# Patient Record
Sex: Female | Born: 1965 | Hispanic: No | Marital: Single | Smoking: Never smoker
Health system: Southern US, Community
[De-identification: ages and names within clinical notes are randomized; demographics above are authoritative.]

## PROBLEM LIST (undated history)

## (undated) DIAGNOSIS — G43109 Migraine with aura, not intractable, without status migrainosus: Secondary | ICD-10-CM

## (undated) HISTORY — DX: Migraine with aura, not intractable, without status migrainosus: G43.109

## (undated) HISTORY — PX: APPENDECTOMY: SHX54

---

## 1998-03-22 ENCOUNTER — Other Ambulatory Visit: Admission: RE | Admit: 1998-03-22 | Discharge: 1998-03-22 | Payer: Self-pay | Admitting: Obstetrics and Gynecology

## 1998-05-03 ENCOUNTER — Other Ambulatory Visit: Admission: RE | Admit: 1998-05-03 | Discharge: 1998-05-03 | Payer: Self-pay | Admitting: Obstetrics and Gynecology

## 1998-09-08 ENCOUNTER — Other Ambulatory Visit: Admission: RE | Admit: 1998-09-08 | Discharge: 1998-09-08 | Payer: Self-pay | Admitting: Obstetrics and Gynecology

## 1999-01-03 HISTORY — PX: CERVICAL BIOPSY  W/ LOOP ELECTRODE EXCISION: SUR135

## 1999-03-21 ENCOUNTER — Other Ambulatory Visit: Admission: RE | Admit: 1999-03-21 | Discharge: 1999-03-21 | Payer: Self-pay | Admitting: Obstetrics and Gynecology

## 1999-04-11 ENCOUNTER — Other Ambulatory Visit: Admission: RE | Admit: 1999-04-11 | Discharge: 1999-04-11 | Payer: Self-pay | Admitting: Obstetrics and Gynecology

## 1999-10-04 ENCOUNTER — Other Ambulatory Visit: Admission: RE | Admit: 1999-10-04 | Discharge: 1999-10-04 | Payer: Self-pay | Admitting: Obstetrics and Gynecology

## 2000-04-19 ENCOUNTER — Other Ambulatory Visit: Admission: RE | Admit: 2000-04-19 | Discharge: 2000-04-19 | Payer: Self-pay | Admitting: Obstetrics and Gynecology

## 2000-10-08 ENCOUNTER — Other Ambulatory Visit: Admission: RE | Admit: 2000-10-08 | Discharge: 2000-10-08 | Payer: Self-pay | Admitting: Obstetrics and Gynecology

## 2001-04-24 ENCOUNTER — Other Ambulatory Visit: Admission: RE | Admit: 2001-04-24 | Discharge: 2001-04-24 | Payer: Self-pay | Admitting: Obstetrics and Gynecology

## 2002-04-28 ENCOUNTER — Other Ambulatory Visit: Admission: RE | Admit: 2002-04-28 | Discharge: 2002-04-28 | Payer: Self-pay | Admitting: Obstetrics and Gynecology

## 2002-10-06 ENCOUNTER — Other Ambulatory Visit: Admission: RE | Admit: 2002-10-06 | Discharge: 2002-10-06 | Payer: Self-pay | Admitting: Obstetrics and Gynecology

## 2003-04-21 ENCOUNTER — Other Ambulatory Visit: Admission: RE | Admit: 2003-04-21 | Discharge: 2003-04-21 | Payer: Self-pay | Admitting: Obstetrics and Gynecology

## 2003-10-27 ENCOUNTER — Other Ambulatory Visit: Admission: RE | Admit: 2003-10-27 | Discharge: 2003-10-27 | Payer: Self-pay | Admitting: Obstetrics and Gynecology

## 2004-04-22 ENCOUNTER — Other Ambulatory Visit: Admission: RE | Admit: 2004-04-22 | Discharge: 2004-04-22 | Payer: Self-pay | Admitting: Obstetrics and Gynecology

## 2011-02-07 ENCOUNTER — Ambulatory Visit (INDEPENDENT_AMBULATORY_CARE_PROVIDER_SITE_OTHER): Payer: BC Managed Care – PPO | Admitting: Family Medicine

## 2011-02-07 VITALS — BP 141/92 | HR 101 | Temp 98.4°F | Resp 16 | Ht 65.0 in | Wt 138.0 lb

## 2011-02-07 DIAGNOSIS — J209 Acute bronchitis, unspecified: Secondary | ICD-10-CM

## 2011-02-07 DIAGNOSIS — J329 Chronic sinusitis, unspecified: Secondary | ICD-10-CM

## 2011-02-07 MED ORDER — BENZONATATE 100 MG PO CAPS: 100.0000 mg | ORAL_CAPSULE | Freq: Three times a day (TID) | ORAL | Status: AC | PRN

## 2011-02-07 MED ORDER — AZITHROMYCIN 250 MG PO TABS: ORAL_TABLET | ORAL | Status: AC

## 2011-02-07 NOTE — Progress Notes (Signed)
  Patient Name: Amy [name] Date of Birth: 1965-09-24 Medical Record Number: [id number] Gender: female Date of Encounter: 02/07/2011  History of Present Illness:  Amy [name] is a 46 y.o. very pleasant female patient who presents with the following:  Started to get ill 3 days ago with cold sx, "deep sneeze."  Thick drainage in her throat, thirsty.  Has started to have a cough now, cough attacks.  Delsym and cough drops to help control cough.  Mild HA the last 2 days- a "twinge."  History of seasonal AR, using neti-pot BID and saline spray.  Sinuses are tender, crusty nasal discharge.  Did not exercise today which is very unusual for her, feels fatigued but no aches.  No fever or chills.  No GI sx  There is no problem list on file for this patient.  Past Medical History  Diagnosis Date  . Migraine aura without headache    Past Surgical History  Procedure Date  . Cervical biopsy  w/ loop electrode excision 2001    no further procedures needed   History  Substance Use Topics  . Smoking status: Never Smoker   . Smokeless tobacco: Never Used  . Alcohol Use: Yes     very occasionally   Family History  Problem Relation Age of Onset  . Heart disease Father   . Stroke Maternal Grandfather    Allergies  Allergen Reactions  . Penicillins Hives    Medication list has been reviewed and updated.  Review of Systems: As per HPI, otherwise negative  Physical Examination: Filed Vitals:   02/07/11 1552  BP: 141/92  Pulse: 101  Temp: 98.4 F (36.9 C)  TempSrc: Oral  Resp: 16  Height: 5\' 5"  (1.651 m)  Weight: 138 lb (62.596 kg)    Body mass index is 22.96 kg/(m^2).  GEN: WDWN, NAD, Non-toxic, A & O x 3 HEENT: Atraumatic, Normocephalic. Neck supple. No masses, No LAD. Ears and Nose: No external deformity.  TM wnl, oropharynx wnl.  Nares are irritated, nasal cavity inflamed, frontal sinuses tender CV: RRR, No M/G/R. No JVD. No thrill. No extra heart sounds. PULM: CTA  B, no wheezes, crackles, rhonchi. No retractions. No resp. distress. No accessory muscle use. Skin:  Some blanching hives over chest but no other rash EXTR: No c/c/e NEURO Normal gait.  PSYCH: Normally interactive. Conversant. Not depressed or anxious appearing.  Calm demeanor.   Assessment and Plan: 1. Sinusitis  azithromycin (ZITHROMAX) 250 MG tablet, benzonatate (TESSALON) 100 MG capsule   Patient (or parent if minor) instructed to return to clinic or call if not better in 3 day(s). Rest, plenty of fluids.  Follow- up   BP mildly elevated today- usually is fine.  Suspect due to OTC cold medication use.  Will follow- up if persistent

## 2012-08-23 ENCOUNTER — Telehealth: Payer: Self-pay

## 2012-08-23 ENCOUNTER — Ambulatory Visit (INDEPENDENT_AMBULATORY_CARE_PROVIDER_SITE_OTHER): Payer: BC Managed Care – PPO | Admitting: Family Medicine

## 2012-08-23 VITALS — BP 132/84 | HR 84 | Temp 98.0°F | Resp 18 | Ht 65.0 in | Wt 138.0 lb

## 2012-08-23 DIAGNOSIS — G43909 Migraine, unspecified, not intractable, without status migrainosus: Secondary | ICD-10-CM

## 2012-08-23 MED ORDER — ELETRIPTAN HYDROBROMIDE 40 MG PO TABS: ORAL_TABLET | ORAL | Status: DC

## 2012-08-23 MED ORDER — PROMETHAZINE HCL 25 MG/ML IJ SOLN
25.0000 mg | Freq: Once | INTRAMUSCULAR | Status: AC
  Administered 2012-08-23: 25 mg via INTRAMUSCULAR

## 2012-08-23 NOTE — Telephone Encounter (Signed)
Patient was seen today and migraine treated. Can we work on her prior auth?

## 2012-08-23 NOTE — Telephone Encounter (Signed)
Patient calling to check status of prior authorization. She said her pharmacy has sent a request yesterday. Patient says she has a migraine and needs this medication that was prescribed by Dr. Patsy Lager. She says it is urgent.   [phone]

## 2012-08-23 NOTE — Telephone Encounter (Signed)
LMOM for pt at # pt left. Advised her that we will not be able to get a PA approved today, usually takes at least 1-2 days. Dr Patsy Lager suggested two options. Pt could buy a couple of tablets to get her over this HA, or we can try to send in a different triptan, such as sumatriptan to see if it will be covered. Asked pt to CB if she wants a different Rx, and/or to call if she has info about other medications she has tried/failed for migraines that could help to get the Relpax approved next week.

## 2012-08-23 NOTE — Patient Instructions (Addendum)
Rest- let me know if you do not feel better by later today.  If you have any other problems please let me know.

## 2012-08-23 NOTE — Progress Notes (Signed)
[facility] [address], [place] [address] [phone]  Date:  08/23/2012   Name:  Amy [name]   DOB:  07-15-1965   MRN:  [id number]  PCP:  Juluis Mire, MD    Chief Complaint: Nausea, Migraine and Photophobia   History of Present Illness:  Amy [name] is a 47 y.o. very pleasant female patient who presents with the following:  History of migraine HA, but she has not had a migraine in a couple of years. 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  Noted a HA this am and took advil.  Went back to bed and rested.  Mid- morning she noted nausea, and worsening pain.  She had dry- heaves at school.  She notes the HA on the right side of her head, and she notes photophobia and mild phonophobia. This HA is less severe than migraines she has had in the past.    She thinks she may have gotten this HA due to recent lack of sleep and stress.  Her mother brought her in today.   LMP 2 days ago.  She states there is no chance of pregnancy.   She is otherwise generally healthy   She has done much better recently through avoiding trigger foods, alcohol, stress, and by getting enough sleep.    There are no active problems to display for this patient.   Past Medical History  Diagnosis Date  . Migraine aura without headache     Past Surgical History  Procedure Laterality Date  . Cervical biopsy  w/ loop electrode excision  2001    no further procedures needed    History  Substance Use Topics  . Smoking status: Never Smoker   . Smokeless tobacco: Never Used  . Alcohol Use: Yes     Comment: very occasionally    Family History  Problem Relation Age of Onset  . Heart disease Father   . Stroke Maternal Grandfather     Allergies  Allergen Reactions  . Penicillins Hives    Medication list has been reviewed and updated.  Current Outpatient Prescriptions on File Prior to Visit  Medication Sig Dispense Refill  . aspirin 81 MG tablet Take  81 mg by mouth daily.      . calcium carbonate 200 MG capsule Take by mouth daily.       . cetirizine (ZYRTEC) 10 MG tablet Take 10 mg by mouth daily.      . Multiple Vitamin (MULTIVITAMIN) tablet Take 1 tablet by mouth daily.       No current facility-administered medications on file prior to visit.    Review of Systems:  As per HPI- otherwise negative.   Physical Examination: Filed Vitals:   08/23/12 1410  BP: 132/84  Pulse: 84  Temp: 98 F (36.7 C)  Resp: 18   Filed Vitals:   08/23/12 1410  Height: 5\' 5"  (1.651 m)  Weight: 138 lb (62.596 kg)   Body mass index is 22.96 kg/(m^2). Ideal Body Weight: Weight in (lb) to have BMI = 25: 149.9  GEN: WDWN, NAD, Non-toxic, A & O x 3, sitting in darkened room but able to tolerate light being on.  Pleasant and calm HEENT: Atraumatic, Normocephalic. Neck supple. No masses, No LAD. Bilateral TM wnl, oropharynx normal.  PEERL,EOMI.   Ears and Nose: No external deformity. CV: RRR, No M/G/R. No JVD. No thrill. No extra heart sounds. PULM: CTA B, no wheezes, crackles, rhonchi.  No retractions. No resp. distress. No accessory muscle use. ABD: S, NT, ND, +BS. No rebound. No HSM. EXTR: No c/c/e NEURO Normal gait.   Normal LE and UE strength, DTR and sensation all extremities.  Negative cerebellar function, normal RAM of hands  PSYCH: Normally interactive. Conversant. Not depressed or anxious appearing.  Calm demeanor.    Assessment and Plan: Migraine - Plan: eletriptan (RELPAX) 40 MG tablet, promethazine (PHENERGAN) injection 25 mg  Recurrent migraine HA. Refilled her relpax which she has used in the past with success.  Given a shot of phenergan here prior to going home.    Signed Abbe Amsterdam, MD

## 2012-08-26 NOTE — Telephone Encounter (Signed)
LMOM to CB to give [place] info about prior meds tried/failed for migraines. Form is in [place] new PA box.  Pt CB and reported that she has tried Imitrex in the past and it was ineffective. She also used Vioxx which was effective but ins stopped paying for it. She has been stable on Relpax when needed for several years, but is often able to prevent migraines by avoiding known triggers. Completed PA on covermymeds and awaiting decision.

## 2012-08-28 NOTE — Telephone Encounter (Signed)
Received fax from [employer] for add'l info. Stated that Rx is covered for max of #8/mos. Called pharm and they were able to get the quantity of #8 to go through. LMOM for pt with info.

## 2012-09-03 ENCOUNTER — Ambulatory Visit (INDEPENDENT_AMBULATORY_CARE_PROVIDER_SITE_OTHER): Payer: BC Managed Care – PPO | Admitting: Family Medicine

## 2012-09-03 VITALS — BP 118/74 | HR 90 | Temp 98.2°F | Resp 18 | Ht 65.0 in | Wt 139.0 lb

## 2012-09-03 DIAGNOSIS — M25569 Pain in unspecified knee: Secondary | ICD-10-CM

## 2012-09-03 DIAGNOSIS — M7989 Other specified soft tissue disorders: Secondary | ICD-10-CM

## 2012-09-03 DIAGNOSIS — I809 Phlebitis and thrombophlebitis of unspecified site: Secondary | ICD-10-CM

## 2012-09-03 DIAGNOSIS — M25562 Pain in left knee: Secondary | ICD-10-CM

## 2012-09-03 NOTE — Patient Instructions (Addendum)
We will schedule the ultrasound tomorrow - will try to have this done tomorrow after 3:30pm.  Apply to heat to area and other instructions below. Over the counter ibuprofen up to 600mg  every 6 hours as needed with food.  Return to the clinic or go to the nearest emergency room if any of your symptoms worsen or new symptoms occur.  Phlebitis Phlebitis is a redness, tenderness and soreness (inflammation) in a vein. This can occur in your arms, legs, or torso (trunk), as well as deeper inside your body.  CAUSES  Phlebitis can be triggered by multiple factors. These include:  Reduced (restricted) blood flow through your veins. This happens with prolonged bed rest, long distance travel, injury or surgery. Being overweight (obese) and pregnant can also restrict blood flow and lead to phlebitis.  Putting a catheter in the vein (intravenous or IV) and giving certain medications through in the vein (intravenously).  Cancer and cancer treatment.  Use of illegal intravenous drugs.  Inflammatory diseases.  Inherited (genetic) diseases that increase the risk for blood clots.  Hormone therapy (such as birth control pills). SYMPTOMS   Red, tender, swollen, painful area on your skin.  Usually, the area will be long and narrow.  Low grade fever.  Significant firmness along the center of this area. This can indicate that a blood clot has formed.  Surrounding redness or a high fever, which can indicate an infection (cellulitis). DIAGNOSIS   The appearance of your condition and your symptoms will cause your caregiver to suspect phlebitis. Usually, this is enough for a diagnosis.  Your caregiver may request blood tests or an ultrasound test of the area to be sure you do not have an infection or a blood clot. Blood tests and discussing your family history may also indicate if you have an underlying genetic disease that causes blood clots.  Occasionally, a piece of tissue is taken from the body  (biopsy) if an unusual cause of phlebitis is suspected. TREATMENT   Raise (elevate) the affected area above the level of the heart.  Apply a warm compress or heating pad for 20 minutes, 3 or 4 times a day. If you use an electric heating pad, follow the directions so you do not burn yourself.  Anti-inflammatory medications are usually recommended. Follow your caregiver's directions.  Any IV catheter, if present, will be removed by your caregiver.  Your caregiver may prescribe medicines that kill germs (antibiotics) if an infection is present.  Your caregiver may recommend blood thinners if a blood clot is suspected or present.  Support stockings or bandages may be helpful, depending on the cause and location of the phlebitis.  Surgery may be needed to remove very damaged sections of vein, but this is rare. HOME CARE INSTRUCTIONS   Take medications exactly as prescribed.  Follow up with your caregiver as directed.  Use support stockings or bandages if advised. These will speed healing and prevent recurrence.  If you are on blood thinners:  Do follow-up blood tests exactly as directed.  Check with your caregiver before using any new medications.  Wear a pendant to show that you are on blood thinners.  For phlebitis in the legs:  Avoid prolonged standing or bed rest.  Keep your legs moving. Raise your legs with sitting or lying.  Do not smoke.  Women, particularly those over the age of 13, should consider the risks and benefits of taking the contraceptive pill. This kind of hormone treatment can increase your risk for  blood clots. SEEK MEDICAL CARE IF:   You have unusual bruising or any bleeding problems.  Swelling or pain in your affected arm or leg is not gradually improving.  You are on anti-inflammatory medication and you develop belly (abdominal) pain. SEEK IMMEDIATE MEDICAL CARE IF:   An unexplained oral temperature above 100.5 F (38.1 C) develops.  You have  sudden onset of chest pain or difficulty breathing. Document Released: 12/13/2000 Document Revised: 03/13/2011 Document Reviewed: 09/14/2008 [facility] Patient Information 2014 [place], [place].

## 2012-09-03 NOTE — Progress Notes (Signed)
Subjective:    Patient ID: Amy [name], female    DOB: Mar 18, 1965, 47 y.o.   MRN: [id number]  HPI Amy [name] is a 47 y.o. female  6 days ago - woke up with pain in L lower leg. NKI. Tried ice, massage.  Warm knot on inside lower part of leg last week.  More sore if seated then getting up to move. Swollen around lower leg to ankle now. More sore today. Up and down with school.   No recent prolonged car travel or air travel, does take OCP's, but nonsmoker, no hx of blood clots, no FH of clotting disorder.    Tx: ice, blue ice, advil, tylenol - min relief.  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      Review of Systems  Constitutional: Negative for fever, chills and unexpected weight change.  Respiratory: Negative for shortness of breath.   Cardiovascular: Negative for chest pain.  Musculoskeletal: Positive for arthralgias.  Skin: Positive for color change (few small red bumps in swollen area. ).       Objective:   Physical Exam  Vitals reviewed. Constitutional: She is oriented to person, place, and time. She appears well-developed and well-nourished.  HENT:  Head: Normocephalic and atraumatic.  Neck: Carotid bruit is not present.  Cardiovascular: Normal rate, regular rhythm, normal heart sounds, intact distal pulses and normal pulses.   Pulses:      Dorsalis pedis pulses are 2+ on the left side.  Pulmonary/Chest: Effort normal and breath sounds normal.  Abdominal: She exhibits no pulsatile midline mass. There is no tenderness.  Musculoskeletal:       Left lower leg: She exhibits tenderness and swelling. She exhibits no deformity.       Legs: Neurological: She is alert and oriented to person, place, and time.  nvi distally.   Skin: Skin is warm and dry.  Psychiatric: She has a normal mood and affect. Her behavior is normal.       Assessment & Plan:  Amy [name] is a 47 y.o. female Superficial thrombophlebitis - Plan: Lower Extremity  Venous Duplex Left  Pain in joint, lower leg, left - Plan: Lower Extremity Venous Duplex Left  Leg swelling - Plan: Lower Extremity Venous Duplex Left  Suspected LLE superficial thrombophlebitis.  No deep calf ttp, but as on OCP's will check doppler tomorrow to rule out deep component.   Sx care reviewed with heat, ibuprofen, rtc precautions.   Patient Instructions  We will schedule the ultrasound tomorrow - will try to have this done tomorrow after 3:30pm.  Apply to heat to area and other instructions below. Over the counter ibuprofen up to 600mg  every 6 hours as needed with food.  Return to the clinic or go to the nearest emergency room if any of your symptoms worsen or new symptoms occur.  Phlebitis Phlebitis is a redness, tenderness and soreness (inflammation) in a vein. This can occur in your arms, legs, or torso (trunk), as well as deeper inside your body.  CAUSES  Phlebitis can be triggered by multiple factors. These include:  Reduced (restricted) blood flow through your veins. This happens with prolonged bed rest, long distance travel, injury or surgery. Being overweight (obese) and pregnant can also restrict blood flow and lead to phlebitis.  Putting a catheter in the vein (intravenous or IV) and giving certain medications through in the vein (intravenously).  Cancer and cancer treatment.  Use of illegal intravenous drugs.  Inflammatory diseases.  Inherited (genetic)  diseases that increase the risk for blood clots.  Hormone therapy (such as birth control pills). SYMPTOMS   Red, tender, swollen, painful area on your skin.  Usually, the area will be long and narrow.  Low grade fever.  Significant firmness along the center of this area. This can indicate that a blood clot has formed.  Surrounding redness or a high fever, which can indicate an infection (cellulitis). DIAGNOSIS   The appearance of your condition and your symptoms will cause your caregiver to suspect  phlebitis. Usually, this is enough for a diagnosis.  Your caregiver may request blood tests or an ultrasound test of the area to be sure you do not have an infection or a blood clot. Blood tests and discussing your family history may also indicate if you have an underlying genetic disease that causes blood clots.  Occasionally, a piece of tissue is taken from the body (biopsy) if an unusual cause of phlebitis is suspected. TREATMENT   Raise (elevate) the affected area above the level of the heart.  Apply a warm compress or heating pad for 20 minutes, 3 or 4 times a day. If you use an electric heating pad, follow the directions so you do not burn yourself.  Anti-inflammatory medications are usually recommended. Follow your caregiver's directions.  Any IV catheter, if present, will be removed by your caregiver.  Your caregiver may prescribe medicines that kill germs (antibiotics) if an infection is present.  Your caregiver may recommend blood thinners if a blood clot is suspected or present.  Support stockings or bandages may be helpful, depending on the cause and location of the phlebitis.  Surgery may be needed to remove very damaged sections of vein, but this is rare. HOME CARE INSTRUCTIONS   Take medications exactly as prescribed.  Follow up with your caregiver as directed.  Use support stockings or bandages if advised. These will speed healing and prevent recurrence.  If you are on blood thinners:  Do follow-up blood tests exactly as directed.  Check with your caregiver before using any new medications.  Wear a pendant to show that you are on blood thinners.  For phlebitis in the legs:  Avoid prolonged standing or bed rest.  Keep your legs moving. Raise your legs with sitting or lying.  Do not smoke.  Women, particularly those over the age of 34, should consider the risks and benefits of taking the contraceptive pill. This kind of hormone treatment can increase your  risk for blood clots. SEEK MEDICAL CARE IF:   You have unusual bruising or any bleeding problems.  Swelling or pain in your affected arm or leg is not gradually improving.  You are on anti-inflammatory medication and you develop belly (abdominal) pain. SEEK IMMEDIATE MEDICAL CARE IF:   An unexplained oral temperature above 100.5 F (38.1 C) develops.  You have sudden onset of chest pain or difficulty breathing. Document Released: 12/13/2000 Document Revised: 03/13/2011 Document Reviewed: 09/14/2008 [facility] Patient Information 2014 [place], [place].

## 2012-09-04 ENCOUNTER — Ambulatory Visit (HOSPITAL_COMMUNITY)
Admission: RE | Admit: 2012-09-04 | Discharge: 2012-09-04 | Disposition: A | Discharge status: Home / Self Care | Payer: BC Managed Care – PPO | Source: Ambulatory Visit | Attending: Family Medicine | Admitting: Family Medicine

## 2012-09-04 DIAGNOSIS — M7989 Other specified soft tissue disorders: Secondary | ICD-10-CM

## 2012-09-04 DIAGNOSIS — I82819 Embolism and thrombosis of superficial veins of unspecified lower extremities: Secondary | ICD-10-CM | POA: Insufficient documentation

## 2012-09-04 DIAGNOSIS — M25579 Pain in unspecified ankle and joints of unspecified foot: Secondary | ICD-10-CM | POA: Insufficient documentation

## 2012-09-04 DIAGNOSIS — M25562 Pain in left knee: Secondary | ICD-10-CM

## 2012-09-04 DIAGNOSIS — I809 Phlebitis and thrombophlebitis of unspecified site: Secondary | ICD-10-CM

## 2012-09-04 DIAGNOSIS — M79609 Pain in unspecified limb: Secondary | ICD-10-CM | POA: Insufficient documentation

## 2012-09-04 NOTE — Progress Notes (Signed)
VASCULAR LAB PRELIMINARY  PRELIMINARY  PRELIMINARY  PRELIMINARY  Left lower extremity venous duplex completed.    Preliminary report:  Negative for DVT. SVT of GSV in ankle area.  Report called to Dr. Herma Carson.   Dewey Neukam, RVT 09/04/2012, 5:48 PM

## 2012-09-04 NOTE — Progress Notes (Signed)
Called by vascular lab - negative for DVT, but does have superficial clot in greater saphenous from foot to just above ankle. Advised patient to continue plan as last night. Heat, antinflammatories, rtc precautions. Understanding expressed.

## 2012-09-17 ENCOUNTER — Emergency Department (HOSPITAL_COMMUNITY): Payer: BC Managed Care – PPO

## 2012-09-17 ENCOUNTER — Ambulatory Visit (INDEPENDENT_AMBULATORY_CARE_PROVIDER_SITE_OTHER): Payer: BC Managed Care – PPO | Admitting: Family Medicine

## 2012-09-17 ENCOUNTER — Observation Stay (HOSPITAL_COMMUNITY)
Admission: EM | Admit: 2012-09-17 | Discharge: 2012-09-19 | Disposition: A | Discharge status: Home / Self Care | Payer: BC Managed Care – PPO | Attending: General Surgery | Admitting: General Surgery

## 2012-09-17 ENCOUNTER — Encounter (HOSPITAL_COMMUNITY): Payer: Self-pay

## 2012-09-17 VITALS — BP 120/80 | HR 79 | Temp 98.4°F | Resp 16 | Ht 63.0 in | Wt 141.0 lb

## 2012-09-17 DIAGNOSIS — R319 Hematuria, unspecified: Secondary | ICD-10-CM

## 2012-09-17 DIAGNOSIS — Z7982 Long term (current) use of aspirin: Secondary | ICD-10-CM | POA: Insufficient documentation

## 2012-09-17 DIAGNOSIS — R509 Fever, unspecified: Secondary | ICD-10-CM

## 2012-09-17 DIAGNOSIS — R110 Nausea: Secondary | ICD-10-CM

## 2012-09-17 DIAGNOSIS — R1031 Right lower quadrant pain: Secondary | ICD-10-CM

## 2012-09-17 DIAGNOSIS — Z79899 Other long term (current) drug therapy: Secondary | ICD-10-CM | POA: Insufficient documentation

## 2012-09-17 DIAGNOSIS — K3580 Unspecified acute appendicitis: Principal | ICD-10-CM | POA: Insufficient documentation

## 2012-09-17 DIAGNOSIS — D72829 Elevated white blood cell count, unspecified: Secondary | ICD-10-CM

## 2012-09-17 DIAGNOSIS — K37 Unspecified appendicitis: Secondary | ICD-10-CM

## 2012-09-17 DIAGNOSIS — R52 Pain, unspecified: Secondary | ICD-10-CM

## 2012-09-17 LAB — POCT CBC
Granulocyte percent: 88.1 %G — AB (ref 37–80)
HCT, POC: 48 % — AB (ref 37.7–47.9)
Hemoglobin: 15.5 g/dL (ref 12.2–16.2)
MCV: 94.9 fL (ref 80–97)
POC Granulocyte: 17.6 — AB (ref 2–6.9)
POC LYMPH PERCENT: 8.7 %L — AB (ref 10–50)
RBC: 5.06 M/uL (ref 4.04–5.48)

## 2012-09-17 LAB — COMPREHENSIVE METABOLIC PANEL
ALT: 25 U/L (ref 0–35)
AST: 24 U/L (ref 0–37)
Albumin: 3.4 g/dL — ABNORMAL LOW (ref 3.5–5.2)
Alkaline Phosphatase: 42 U/L (ref 39–117)
BUN: 11 mg/dL (ref 6–23)
Chloride: 98 mEq/L (ref 96–112)
Potassium: 3.9 mEq/L (ref 3.5–5.1)
Sodium: 132 mEq/L — ABNORMAL LOW (ref 135–145)
Total Bilirubin: 1 mg/dL (ref 0.3–1.2)
Total Protein: 6.8 g/dL (ref 6.0–8.3)

## 2012-09-17 LAB — POCT URINALYSIS DIPSTICK
Bilirubin, UA: NEGATIVE
Glucose, UA: NEGATIVE
Ketones, UA: 80
Nitrite, UA: NEGATIVE
Spec Grav, UA: 1.02
pH, UA: 7

## 2012-09-17 LAB — CBC WITH DIFFERENTIAL/PLATELET
Basophils Relative: 0 % (ref 0–1)
Eosinophils Absolute: 0 10*3/uL (ref 0.0–0.7)
Hemoglobin: 14.7 g/dL (ref 12.0–15.0)
MCH: 30.7 pg (ref 26.0–34.0)
MCHC: 35.1 g/dL (ref 30.0–36.0)
Monocytes Relative: 7 % (ref 3–12)
Neutro Abs: 16.9 10*3/uL — ABNORMAL HIGH (ref 1.7–7.7)
Neutrophils Relative %: 85 % — ABNORMAL HIGH (ref 43–77)
Platelets: 326 10*3/uL (ref 150–400)
RBC: 4.79 MIL/uL (ref 3.87–5.11)

## 2012-09-17 LAB — POCT UA - MICROSCOPIC ONLY
Bacteria, U Microscopic: NEGATIVE
Mucus, UA: NEGATIVE
Yeast, UA: NEGATIVE

## 2012-09-17 LAB — POCT URINE PREGNANCY: Preg Test, Ur: NEGATIVE

## 2012-09-17 LAB — LIPASE, BLOOD: Lipase: 29 U/L (ref 11–59)

## 2012-09-17 MED ORDER — ONDANSETRON 4 MG PO TBDP
4.0000 mg | ORAL_TABLET | Freq: Once | ORAL | Status: AC
  Administered 2012-09-17: 4 mg via ORAL

## 2012-09-17 MED ORDER — HYDROMORPHONE HCL PF 1 MG/ML IJ SOLN
1.0000 mg | Freq: Once | INTRAMUSCULAR | Status: AC
  Administered 2012-09-17: 1 mg via INTRAVENOUS
  Filled 2012-09-17: qty 1

## 2012-09-17 MED ORDER — IOHEXOL 300 MG/ML  SOLN
100.0000 mL | Freq: Once | INTRAMUSCULAR | Status: AC | PRN
  Administered 2012-09-17: 100 mL via INTRAVENOUS

## 2012-09-17 MED ORDER — IOHEXOL 300 MG/ML  SOLN
50.0000 mL | Freq: Once | INTRAMUSCULAR | Status: AC | PRN
  Administered 2012-09-17: 50 mL via ORAL

## 2012-09-17 MED ORDER — HYDROMORPHONE HCL PF 1 MG/ML IJ SOLN
0.5000 mg | Freq: Once | INTRAMUSCULAR | Status: AC
  Administered 2012-09-17: 0.5 mg via INTRAVENOUS
  Filled 2012-09-17: qty 1

## 2012-09-17 MED ORDER — SODIUM CHLORIDE 0.9 % IV BOLUS (SEPSIS)
1000.0000 mL | INTRAVENOUS | Status: AC
  Administered 2012-09-17: 1000 mL via INTRAVENOUS

## 2012-09-17 NOTE — Patient Instructions (Addendum)
Go to [facility] Emergency Room for evaluation as soon as you leave the office. I did advise the charge nurse of your arrival.

## 2012-09-17 NOTE — Progress Notes (Signed)
Subjective:    Patient ID: Amy [name], female    DOB: 08/01/1965, 47 y.o.   MRN: [id number]  HPI Amy [name] is a 47 y.o. female Started 10:30 this am with abdominal pain.  No appetite, hot and cold spells, sweating at times. Nausea with smells, no vomiting - few episodes of dry heaves. Minimal water. No dysuria, hematuria or other urinary sx's. No vaginal bleeding or discharge.  No abdominal surgeries. Last PO - breakfast at 5am - egg whites, oatmeal, peach. No prior similar sx's. Last BM this afternoon - normal, no recent diarrhea.   LMP 08/21/12. On continuous contraception for 3 months at a time.   G0PO, no abdominal surgeries.   Tx: none.   SH: no known sick contacts. Rare Etoh. None recently.    Review of Systems  Constitutional: Positive for fever (subj. ), chills and appetite change.  Respiratory: Negative for cough and shortness of breath.   Cardiovascular: Negative for chest pain and leg swelling.  Gastrointestinal: Positive for abdominal pain. Negative for diarrhea, abdominal distention and anal bleeding.  Genitourinary: Negative for dysuria, urgency, frequency, hematuria, flank pain and difficulty urinating.  Skin: Negative for rash.   As above.     Objective:   Physical Exam  Vitals reviewed. Constitutional: She is oriented to person, place, and time. She appears well-developed and well-nourished. No distress.  HENT:  Head: Normocephalic and atraumatic.  Eyes: Conjunctivae and EOM are normal. Pupils are equal, round, and reactive to light.  Neck: Carotid bruit is not present.  Cardiovascular: Normal rate, regular rhythm, normal heart sounds and intact distal pulses.   Pulmonary/Chest: Effort normal and breath sounds normal.  Abdominal: Soft. Normal appearance and bowel sounds are normal. She exhibits no distension and no pulsatile midline mass. There is no hepatosplenomegaly. There is tenderness in the right lower quadrant. There is guarding and tenderness  at McBurney's point. There is no rebound, no CVA tenderness and negative Murphy's sign.  Neurological: She is alert and oriented to person, place, and time.  Skin: Skin is warm and dry.  Psychiatric: She has a normal mood and affect. Her behavior is normal.   Zofran 4mg  ODT given in office.    Results for orders placed in visit on 09/17/12  POCT CBC      Result Value Range   WBC 20.0 (*) 4.6 - 10.2 K/uL   Lymph, poc 1.7  0.6 - 3.4   POC LYMPH PERCENT 8.7 (*) 10 - 50 %L   MID (cbc) 0.6  0 - 0.9   POC MID % 3.2  0 - 12 %M   POC Granulocyte 17.6 (*) 2 - 6.9   Granulocyte percent 88.1 (*) 37 - 80 %G   RBC 5.06  4.04 - 5.48 M/uL   Hemoglobin 15.5  12.2 - 16.2 g/dL   HCT, POC 52.8 (*) 41.3 - 47.9 %   MCV 94.9  80 - 97 fL   MCH, POC 30.6  27 - 31.2 pg   MCHC 32.3  31.8 - 35.4 g/dL   RDW, POC 24.4     Platelet Count, POC 358  142 - 424 K/uL   MPV 9.4  0 - 99.8 fL  POCT URINE PREGNANCY      Result Value Range   Preg Test, Ur Negative    POCT UA - MICROSCOPIC ONLY      Result Value Range   WBC, Ur, HPF, POC 2-4     RBC, urine, microscopic 4-7  Bacteria, U Microscopic neg     Mucus, UA neg     Epithelial cells, urine per micros 1-2     Crystals, Ur, HPF, POC neg     Casts, Ur, LPF, POC neg     Yeast, UA neg    POCT URINALYSIS DIPSTICK      Result Value Range   Color, UA yellow     Clarity, UA clear     Glucose, UA neg     Bilirubin, UA neg     Ketones, UA 80     Spec Grav, UA 1.020     Blood, UA trace-lysed     pH, UA 7.0     Protein, UA neg     Urobilinogen, UA 0.2     Nitrite, UA neg     Leukocytes, UA Trace     2045- uncomfortable, but non toxic - discussed results above.      Assessment & Plan:   Amy [name] is a 47 y.o. female Acute right lower quadrant pain - Plan: POCT CBC, POCT urine pregnancy, POCT UA - Microscopic Only, POCT urinalysis dipstick  Nausea alone - Plan: POCT UA - Microscopic Only, POCT urinalysis dipstick, ondansetron (ZOFRAN-ODT)  disintegrating tablet 4 mg  Fever, unspecified - Plan: POCT CBC, POCT UA - Microscopic Only, POCT urinalysis dipstick  RLQ abd pain - acute in nature since 11am with fever/chills, nausea, guarded exam, but focal TTP RLQ, and marked leukocytosis.  Will have evaluated at [facility] for suspected appendicitis, with probable CT scan. Charge nurse advised.   Trace hematuria, with few wbc/rbc on UA - check urine cx.

## 2012-09-17 NOTE — ED Provider Notes (Signed)
CSN: [id number]     Arrival date & time 09/17/12  2120 History   First MD Initiated Contact with Patient 09/17/12 2130     Chief Complaint  Patient presents with  . Abdominal Pain   (Consider location/radiation/quality/duration/timing/severity/associated sxs/prior Treatment) Patient is a 47 y.o. female presenting with abdominal pain. The history is provided by the patient.  Abdominal Pain Pain location:  RLQ Pain quality: sharp   Pain radiates to:  Does not radiate Pain severity:  Moderate Onset quality:  Gradual Duration:  12 hours Timing:  Constant Progression:  Worsening Chronicity:  New Relieved by:  Nothing Worsened by:  Nothing tried Ineffective treatments:  None tried Associated symptoms: no chest pain, no cough, no diarrhea, no dysuria, no fatigue, no fever, no hematuria, no nausea, no shortness of breath and no vomiting     Past Medical History  Diagnosis Date  . Migraine aura without headache    Past Surgical History  Procedure Laterality Date  . Cervical biopsy  w/ loop electrode excision  2001    no further procedures needed   Family History  Problem Relation Age of Onset  . Heart disease Father   . Stroke Maternal Grandfather    History  Substance Use Topics  . Smoking status: Never Smoker   . Smokeless tobacco: Never Used  . Alcohol Use: Yes     Comment: very occasionally   OB History   Grav Para Term Preterm Abortions TAB SAB Ect Mult Living                 Review of Systems  Constitutional: Negative for fever and fatigue.  HENT: Negative for congestion, drooling and neck pain.   Eyes: Negative for pain.  Respiratory: Negative for cough and shortness of breath.   Cardiovascular: Negative for chest pain.  Gastrointestinal: Positive for abdominal pain. Negative for nausea, vomiting and diarrhea.  Genitourinary: Negative for dysuria and hematuria.  Musculoskeletal: Negative for back pain and gait problem.  Skin: Negative for color change.   Neurological: Negative for dizziness and headaches.  Hematological: Negative for adenopathy.  Psychiatric/Behavioral: Negative for behavioral problems.  All other systems reviewed and are negative.    Allergies  Penicillins  Home Medications   Current Outpatient Rx  Name  Route  Sig  Dispense  Refill  . aspirin 81 MG tablet   Oral   Take 81 mg by mouth daily.         . calcium carbonate 200 MG capsule   Oral   Take by mouth daily.          . cetirizine (ZYRTEC) 10 MG tablet   Oral   Take 10 mg by mouth daily.         Marland Kitchen eletriptan (RELPAX) 40 MG tablet      One tablet by mouth at onset of headache.  80mg  total in 24 hours   10 tablet   0   . Multiple Vitamin (MULTIVITAMIN) tablet   Oral   Take 1 tablet by mouth daily.         . norgestrel-ethinyl estradiol (CRYSELLE-28) 0.3-30 MG-MCG tablet   Oral   Take 1 tablet by mouth daily. CONTINOUSLY FOR 3 MONTHS          BP 132/72  Pulse 90  Temp(Src) 98.7 F (37.1 C) (Oral)  Resp 18  Ht 5\' 5"  (1.651 m)  Wt 141 lb (63.957 kg)  BMI 23.46 kg/m2  SpO2 98%  LMP 08/21/2012 Physical Exam  Nursing note and vitals reviewed. Constitutional: She is oriented to person, place, and time. She appears well-developed and well-nourished.  HENT:  Head: Normocephalic.  Mouth/Throat: No oropharyngeal exudate.  Eyes: Conjunctivae and EOM are normal. Pupils are equal, round, and reactive to light.  Neck: Normal range of motion. Neck supple.  Cardiovascular: Normal rate, regular rhythm, normal heart sounds and intact distal pulses.  Exam reveals no gallop and no friction rub.   No murmur heard. Pulmonary/Chest: Effort normal and breath sounds normal. No respiratory distress. She has no wheezes.  Abdominal: Soft. Bowel sounds are normal. There is tenderness (moderate RLQ pain). There is no rebound and no guarding.  Musculoskeletal: Normal range of motion. She exhibits no edema and no tenderness.  Neurological: She is alert  and oriented to person, place, and time.  Skin: Skin is warm and dry.  Psychiatric: She has a normal mood and affect. Her behavior is normal.    ED Course  Procedures (including critical care time) Labs Review Labs Reviewed  CBC WITH DIFFERENTIAL - Abnormal; Notable for the following:    WBC 19.9 (*)    Neutrophils Relative % 85 (*)    Neutro Abs 16.9 (*)    Lymphocytes Relative 8 (*)    Monocytes Absolute 1.4 (*)    All other components within normal limits  COMPREHENSIVE METABOLIC PANEL - Abnormal; Notable for the following:    Sodium 132 (*)    Glucose, Bld 115 (*)    Albumin 3.4 (*)    All other components within normal limits  URINALYSIS W MICROSCOPIC + REFLEX CULTURE - Abnormal; Notable for the following:    Leukocytes, UA TRACE (*)    All other components within normal limits  CBC - Abnormal; Notable for the following:    WBC 18.1 (*)    All other components within normal limits  MRSA PCR SCREENING  LIPASE, BLOOD  CREATININE, SERUM   Imaging Review Ct Abdomen Pelvis W Contrast  09/18/2012   CLINICAL DATA:  Right lower quadrant pain  EXAM: CT ABDOMEN AND PELVIS WITH CONTRAST  TECHNIQUE: Multidetector CT imaging of the abdomen and pelvis was performed using the standard protocol following bolus administration of intravenous contrast.  CONTRAST:  OMNIPAQUE IOHEXOL 300 MG/ML  SOLN  COMPARISON:  None.  FINDINGS: BODY WALL: Unremarkable.  LOWER CHEST:  Mediastinum: Unremarkable.  Lungs/pleura: No consolidation.  ABDOMEN/PELVIS:  Liver: Geographic 2 cm low-attenuation area along the falciform ligament is likely perfusion only or focal fatty infiltration based on location, shape, and delayed imaging appearance.  Biliary: No evidence of biliary obstruction or stone.  Pancreas: Unremarkable.  Spleen: Unremarkable.  Adrenals: Unremarkable.  Kidneys and ureters: No hydronephrosis or stone.  Bladder: Unremarkable.  Bowel: Distended appendix with thick enhancing walls, directed  medially and superiorly. The neighboring fat is infiltrated. Along the medial margin and upper margin there is some discontinuous mural enhancement, likely necrosis. No abscess identified. No bowel obstruction.  Retroperitoneum: No mass or adenopathy.  Peritoneum: Reactive free fluid in the low pelvis.  Reproductive: Unremarkable.  Vascular: No acute abnormality.  OSSEOUS: No acute abnormalities. No suspicious lytic or blastic lesions.  CriticalValue/emergent results were called by telephone at the time of interpretation on 09/18/2012 at 12:16 [facility], Betsi Crespi , who verbally acknowledged these results.  IMPRESSION: Acute appendicitis with mural necrosis.  No abscess.   Electronically Signed   By: Tiburcio Pea   On: 09/18/2012 00:17    MDM   1. Appendicitis    9:39  PM 47 y.o. female who presents with right lower quadrant pain which began around 10 AM this morning. She notes persistent pain throughout the day. Denies any fever, has felt nauseous but no vomiting. Seen in urgent care with an elevated white count and sent here for evaluation. Suspect appendicitis. The patient is stable to await CT imaging, will get labs, IV fluid, Dilaudid for pain control.  Found to have appendicitis, will admit to [facility].     Junius Argyle, MD 09/18/12 [phone]

## 2012-09-17 NOTE — ED Notes (Signed)
Pt complains of rt lower quad pain that started about 1030 am, was seen at Urgent Care and sent here for further evaluation for appendicitis, Dr Chilton Si called and states that she has an elevated WBC, no fever

## 2012-09-18 ENCOUNTER — Encounter (HOSPITAL_COMMUNITY): Payer: Self-pay | Admitting: Surgery

## 2012-09-18 ENCOUNTER — Observation Stay (HOSPITAL_COMMUNITY): Payer: BC Managed Care – PPO | Admitting: Anesthesiology

## 2012-09-18 ENCOUNTER — Encounter (HOSPITAL_COMMUNITY): Payer: Self-pay | Admitting: Anesthesiology

## 2012-09-18 ENCOUNTER — Encounter (HOSPITAL_COMMUNITY)
Admission: EM | Disposition: A | Discharge status: Home / Self Care | Payer: Self-pay | Source: Home / Self Care | Attending: Emergency Medicine

## 2012-09-18 DIAGNOSIS — R1031 Right lower quadrant pain: Secondary | ICD-10-CM

## 2012-09-18 DIAGNOSIS — K3580 Unspecified acute appendicitis: Secondary | ICD-10-CM

## 2012-09-18 DIAGNOSIS — K37 Unspecified appendicitis: Secondary | ICD-10-CM | POA: Diagnosis present

## 2012-09-18 HISTORY — PX: LAPAROSCOPIC APPENDECTOMY: SHX408

## 2012-09-18 LAB — CBC
HCT: 40.3 % (ref 36.0–46.0)
Hemoglobin: 13.9 g/dL (ref 12.0–15.0)
MCHC: 34.5 g/dL (ref 30.0–36.0)
WBC: 18.1 10*3/uL — ABNORMAL HIGH (ref 4.0–10.5)

## 2012-09-18 LAB — URINALYSIS W MICROSCOPIC + REFLEX CULTURE
Bilirubin Urine: NEGATIVE
Glucose, UA: NEGATIVE mg/dL
Hgb urine dipstick: NEGATIVE
Ketones, ur: NEGATIVE mg/dL
Nitrite: NEGATIVE
Specific Gravity, Urine: 1.012 (ref 1.005–1.030)
pH: 7 (ref 5.0–8.0)

## 2012-09-18 LAB — CREATININE, SERUM
Creatinine, Ser: 0.71 mg/dL (ref 0.50–1.10)
GFR calc Af Amer: >90 mL/min (ref >90)
GFR calc non Af Amer: >90 mL/min (ref >90)

## 2012-09-18 SURGERY — APPENDECTOMY, LAPAROSCOPIC
Anesthesia: General | Site: Abdomen | Wound class: Clean Contaminated

## 2012-09-18 MED ORDER — PROPOFOL 10 MG/ML IV BOLUS
INTRAVENOUS | Status: DC | PRN
  Administered 2012-09-18: 175 mg via INTRAVENOUS

## 2012-09-18 MED ORDER — CISATRACURIUM BESYLATE (PF) 10 MG/5ML IV SOLN
INTRAVENOUS | Status: DC | PRN
  Administered 2012-09-18: 2 mg via INTRAVENOUS
  Administered 2012-09-18: 6 mg via INTRAVENOUS

## 2012-09-18 MED ORDER — MORPHINE SULFATE 2 MG/ML IJ SOLN
1.0000 mg | INTRAMUSCULAR | Status: DC | PRN
  Administered 2012-09-18 – 2012-09-19 (×7): 1 mg via INTRAVENOUS
  Filled 2012-09-18 (×7): qty 1

## 2012-09-18 MED ORDER — MIDAZOLAM HCL 5 MG/5ML IJ SOLN
INTRAMUSCULAR | Status: DC | PRN
  Administered 2012-09-18: 1 mg via INTRAVENOUS
  Administered 2012-09-18: .5 mg via INTRAVENOUS

## 2012-09-18 MED ORDER — ONDANSETRON HCL 4 MG/2ML IJ SOLN: 4.0000 mg | Freq: Four times a day (QID) | INTRAMUSCULAR | Status: DC | PRN

## 2012-09-18 MED ORDER — PROMETHAZINE HCL 25 MG/ML IJ SOLN: 6.2500 mg | INTRAMUSCULAR | Status: DC | PRN

## 2012-09-18 MED ORDER — ONDANSETRON HCL 4 MG/2ML IJ SOLN
INTRAMUSCULAR | Status: DC | PRN
  Administered 2012-09-18: 4 mg via INTRAVENOUS

## 2012-09-18 MED ORDER — SODIUM CHLORIDE 0.9 % IV SOLN
1.0000 g | INTRAVENOUS | Status: DC
  Administered 2012-09-18 – 2012-09-19 (×2): 1 g via INTRAVENOUS
  Filled 2012-09-18 (×2): qty 1

## 2012-09-18 MED ORDER — GLYCOPYRROLATE 0.2 MG/ML IJ SOLN
INTRAMUSCULAR | Status: DC | PRN
  Administered 2012-09-18: .3 mg via INTRAVENOUS

## 2012-09-18 MED ORDER — DEXAMETHASONE SODIUM PHOSPHATE 10 MG/ML IJ SOLN
INTRAMUSCULAR | Status: DC | PRN
  Administered 2012-09-18: 10 mg via INTRAVENOUS

## 2012-09-18 MED ORDER — FENTANYL CITRATE 0.05 MG/ML IJ SOLN
INTRAMUSCULAR | Status: DC | PRN
  Administered 2012-09-18 (×5): 50 ug via INTRAVENOUS

## 2012-09-18 MED ORDER — LACTATED RINGERS IV SOLN
INTRAVENOUS | Status: DC | PRN
  Administered 2012-09-18 (×2): via INTRAVENOUS

## 2012-09-18 MED ORDER — OXYCODONE HCL 5 MG PO TABS: 5.0000 mg | ORAL_TABLET | Freq: Once | ORAL | Status: DC | PRN

## 2012-09-18 MED ORDER — DIPHENHYDRAMINE HCL 50 MG/ML IJ SOLN: 12.5000 mg | Freq: Four times a day (QID) | INTRAMUSCULAR | Status: DC | PRN

## 2012-09-18 MED ORDER — SUCCINYLCHOLINE CHLORIDE 20 MG/ML IJ SOLN
INTRAMUSCULAR | Status: DC | PRN
  Administered 2012-09-18: 100 mg via INTRAVENOUS

## 2012-09-18 MED ORDER — MEPERIDINE HCL 50 MG/ML IJ SOLN: 6.2500 mg | INTRAMUSCULAR | Status: DC | PRN

## 2012-09-18 MED ORDER — ELETRIPTAN HYDROBROMIDE 20 MG PO TABS
20.0000 mg | ORAL_TABLET | ORAL | Status: DC | PRN
  Administered 2012-09-18: 20 mg via ORAL
  Filled 2012-09-18: qty 1

## 2012-09-18 MED ORDER — KCL IN DEXTROSE-NACL 20-5-0.45 MEQ/L-%-% IV SOLN
INTRAVENOUS | Status: DC
  Administered 2012-09-18: 03:00:00 via INTRAVENOUS
  Administered 2012-09-18: 125 mL via INTRAVENOUS
  Administered 2012-09-19: 08:00:00 via INTRAVENOUS
  Filled 2012-09-18 (×6): qty 1000

## 2012-09-18 MED ORDER — METOCLOPRAMIDE HCL 5 MG/ML IJ SOLN
INTRAMUSCULAR | Status: DC | PRN
  Administered 2012-09-18: 5 mg via INTRAVENOUS

## 2012-09-18 MED ORDER — DIPHENHYDRAMINE HCL 12.5 MG/5ML PO ELIX
12.5000 mg | ORAL_SOLUTION | Freq: Four times a day (QID) | ORAL | Status: DC | PRN
  Administered 2012-09-18: 12.5 mg via ORAL
  Filled 2012-09-18: qty 5

## 2012-09-18 MED ORDER — HEPARIN SODIUM (PORCINE) 5000 UNIT/ML IJ SOLN
5000.0000 [IU] | Freq: Three times a day (TID) | INTRAMUSCULAR | Status: DC
  Administered 2012-09-18 – 2012-09-19 (×4): 5000 [IU] via SUBCUTANEOUS
  Filled 2012-09-18 (×8): qty 1

## 2012-09-18 MED ORDER — OXYCODONE HCL 5 MG/5ML PO SOLN
5.0000 mg | Freq: Once | ORAL | Status: DC | PRN
  Filled 2012-09-18: qty 5

## 2012-09-18 MED ORDER — HYDROMORPHONE HCL PF 1 MG/ML IJ SOLN: 0.2500 mg | INTRAMUSCULAR | Status: DC | PRN

## 2012-09-18 MED ORDER — NEOSTIGMINE METHYLSULFATE 1 MG/ML IJ SOLN
INTRAMUSCULAR | Status: DC | PRN
  Administered 2012-09-18: 3 mg via INTRAVENOUS

## 2012-09-18 SURGICAL SUPPLY — 39 items
APL SKNCLS STERI-STRIP NONHPOA (GAUZE/BANDAGES/DRESSINGS) ×1
APPLIER CLIP ROT 10 11.4 M/L (STAPLE)
APR CLP MED LRG 11.4X10 (STAPLE)
BAG SPEC RTRVL LRG 6X4 10 (ENDOMECHANICALS) ×2
BENZOIN TINCTURE PRP APPL 2/3 (GAUZE/BANDAGES/DRESSINGS) ×2 IMPLANT
CANISTER SUCTION 2500CC (MISCELLANEOUS) ×2 IMPLANT
CLIP APPLIE ROT 10 11.4 M/L (STAPLE) IMPLANT
CLOTH BEACON ORANGE TIMEOUT ST (SAFETY) ×2 IMPLANT
COVER SURGICAL LIGHT HANDLE (MISCELLANEOUS) ×2 IMPLANT
CUTTER FLEX LINEAR 45M (STAPLE) IMPLANT
DECANTER SPIKE VIAL GLASS SM (MISCELLANEOUS) IMPLANT
DRAPE LAPAROSCOPIC ABDOMINAL (DRAPES) ×2 IMPLANT
ELECT REM PT RETURN 9FT ADLT (ELECTROSURGICAL) ×2
ELECTRODE REM PT RTRN 9FT ADLT (ELECTROSURGICAL) ×1 IMPLANT
ENDOLOOP SUT PDS II  0 18 (SUTURE)
ENDOLOOP SUT PDS II 0 18 (SUTURE) IMPLANT
GLOVE BIOGEL M 8.0 STRL (GLOVE) ×2 IMPLANT
GLOVE BIOGEL PI IND STRL 7.0 (GLOVE) ×1 IMPLANT
GLOVE BIOGEL PI INDICATOR 7.0 (GLOVE) ×1
GOWN PREVENTION PLUS LG XLONG (DISPOSABLE) ×2 IMPLANT
GOWN STRL REIN XL XLG (GOWN DISPOSABLE) ×8 IMPLANT
KIT BASIN OR (CUSTOM PROCEDURE TRAY) ×2 IMPLANT
NS IRRIG 1000ML POUR BTL (IV SOLUTION) ×2 IMPLANT
PENCIL BUTTON HOLSTER BLD 10FT (ELECTRODE) IMPLANT
POUCH SPECIMEN RETRIEVAL 10MM (ENDOMECHANICALS) ×4 IMPLANT
RELOAD 45 VASCULAR/THIN (ENDOMECHANICALS) ×2 IMPLANT
RELOAD STAPLE TA45 3.5 REG BLU (ENDOMECHANICALS) ×2 IMPLANT
SCALPEL HARMONIC ACE (MISCELLANEOUS) ×2 IMPLANT
SET IRRIG TUBING LAPAROSCOPIC (IRRIGATION / IRRIGATOR) ×2 IMPLANT
SOLUTION ANTI FOG 6CC (MISCELLANEOUS) ×2 IMPLANT
STRIP CLOSURE SKIN 1/2X4 (GAUZE/BANDAGES/DRESSINGS) ×2 IMPLANT
SUT VIC AB 4-0 SH 18 (SUTURE) ×2 IMPLANT
SYR 30ML LL (SYRINGE) ×2 IMPLANT
TRAY FOLEY CATH 14FRSI W/METER (CATHETERS) ×2 IMPLANT
TRAY LAP CHOLE (CUSTOM PROCEDURE TRAY) ×2 IMPLANT
TROCAR BLADELESS OPT 5 100 (ENDOMECHANICALS) ×4 IMPLANT
TROCAR XCEL BLUNT TIP 100MML (ENDOMECHANICALS) ×2 IMPLANT
TROCAR XCEL NON-BLD 11X100MML (ENDOMECHANICALS) IMPLANT
TUBING INSUFFLATION 10FT LAP (TUBING) ×2 IMPLANT

## 2012-09-18 NOTE — Care Management Note (Signed)
    Page 1 of 1   09/18/2012     11:34:12 AM   CARE MANAGEMENT NOTE 09/18/2012  Patient:  Amy [name], Amy [name]   Account Number:  [id number]  Date Initiated:  09/18/2012  Documentation initiated by:  Lorenda Ishihara  Subjective/Objective Assessment:   48 yo female admitted s/p lap appy. PTA lived at home alone.     Action/Plan:   Home when stable   Anticipated DC Date:  09/19/2012   Anticipated DC Plan:  HOME/SELF CARE      DC Planning Services  CM consult      Choice offered to / List presented to:             Status of service:  Completed, signed off Medicare Important Message given?   (If response is "NO", the following Medicare IM given date fields will be blank) Date Medicare IM given:   Date Additional Medicare IM given:    Discharge Disposition:  HOME/SELF CARE  Per UR Regulation:  Reviewed for med. necessity/level of care/duration of stay  If discussed at Long Length of Stay Meetings, dates discussed:    Comments:

## 2012-09-18 NOTE — Op Note (Signed)
Surgeon: Wenda Low, MD, FACS  Asst:  none  Anes:  general  Procedure: Laparoscopic appendectomy  Diagnosis: Acute appendicitis  Complications: none  EBL:   minimal cc  Description of Procedure:  One gram of Invanz given preop.  Taken to OR 1 and given general anesthesia.  Foley inserted.  Prepped with PCMX.  Timeout performed.  Access achieved with 10 Hassan through the umbilicus.  2 five mm trocars placed in the LLQ and RUQ.  The appendix was stuck to the terminal ileum and appeared to be curled up like a "C:".  It was stuck to the cecum and appeared more chronically inflamed.  The mesentery of the appendix was transected with the Harmonic scalpel.  The base was divided with a endolinear cutter with a white load.  The stump was reexamined to rule out more chronic stump invagination none was seen.  The stump appeared viable and was not bleeding.  The mesentery was hemostatic.  The [facility] port was closed with a figure of 8 of 0 vicryl.  Port sites were injected with marcaine and closed with 4-0 vicryl.  The patient was taken to the recovery room in stable condition.    Matt B. Daphine Deutscher, MD, [facility] Surgery, [place] [phone]

## 2012-09-18 NOTE — Anesthesia Postprocedure Evaluation (Signed)
Anesthesia Post Note  Patient: Amy [name]  Procedure(s) Performed: Procedure(s) (LRB): APPENDECTOMY LAPAROSCOPIC (N/A)  Anesthesia type: General  Patient location: PACU  Post pain: Pain level controlled  Post assessment: Post-op Vital signs reviewed  Last Vitals:  Filed Vitals:   09/18/12 1348  BP: 107/64  Pulse: 80  Temp: 37.1 C  Resp: 20    Post vital signs: Reviewed  Level of consciousness: sedated  Complications: No apparent anesthesia complications

## 2012-09-18 NOTE — Anesthesia Procedure Notes (Signed)
Procedure Name: Intubation Date/Time: 09/18/2012 6:48 AM Performed by: Edison Pace Pre-anesthesia Checklist: Patient identified, Timeout performed, Emergency Drugs available, Suction available and Patient being monitored Patient Re-evaluated:Patient Re-evaluated prior to inductionOxygen Delivery Method: Circle system utilized Preoxygenation: Pre-oxygenation with 100% oxygen Intubation Type: Rapid sequence, IV induction and Cricoid Pressure applied Laryngoscope Size: Mac and 3 Grade View: Grade I Tube type: Oral Tube size: 7.5 mm Number of attempts: 1 Airway Equipment and Method: Stylet Placement Confirmation: ETT inserted through vocal cords under direct vision,  positive ETCO2 and breath sounds checked- equal and bilateral Secured at: 21 cm Tube secured with: Tape Dental Injury: Teeth and Oropharynx as per pre-operative assessment

## 2012-09-18 NOTE — H&P (Signed)
Chief Complaint:  RLQ pain since 10 am yesterday  History of Present Illness:  Amy [name] is an 47 y.o. female who presented first to [place] and then referred to [facility] for CT scan.  This was done and reviewed by me and discussed with radiology.  This might be a phlegmon but more consistent with acute appendicitis.  Informed consent was obtained in the ER with patient and her parents.  Will proceed with lap appy later this am.    Past Medical History  Diagnosis Date  . Migraine aura without headache     Past Surgical History  Procedure Laterality Date  . Cervical biopsy  w/ loop electrode excision  2001    no further procedures needed    No current facility-administered medications for this encounter.   Current Outpatient Prescriptions  Medication Sig Dispense Refill  . aspirin 81 MG tablet Take 81 mg by mouth daily.      . calcium carbonate 200 MG capsule Take by mouth daily.       . cetirizine (ZYRTEC) 10 MG tablet Take 10 mg by mouth daily.      . Multiple Vitamin (MULTIVITAMIN) tablet Take 1 tablet by mouth daily.      . norgestrel-ethinyl estradiol (CRYSELLE-28) 0.3-30 MG-MCG tablet Take 1 tablet by mouth daily. CONTINOUSLY FOR 3 MONTHS      . eletriptan (RELPAX) 40 MG tablet One tablet by mouth at onset of headache.  80mg  total in 24 hours  10 tablet  0   Penicillins Family History  Problem Relation Age of Onset  . Heart disease Father   . Stroke Maternal Grandfather    Social History:   reports that she has never smoked. She has never used smokeless tobacco. She reports that  drinks alcohol. She reports that she does not use illicit drugs.   REVIEW OF SYSTEMS - PERTINENT POSITIVES ONLY: Positive for recent superficial phebitis.  Neg for weight loss or constitutional complaints.  Positive for migraines All else negative  Physical Exam:   Blood pressure 132/72, pulse 90, temperature 98.7 F (37.1 C), temperature source Oral, resp. rate 18, height 5\' 5"  (1.651 m), weight  141 lb (63.957 kg), last menstrual period 08/21/2012, SpO2 98.00%. Body mass index is 23.46 kg/(m^2).  Gen:  WDWN WF NAD  Neurological: Alert and oriented to person, place, and time. Motor and sensory function is grossly intact  Head: Normocephalic and atraumatic.  Eyes: Conjunctivae are normal. Pupils are equal, round, and reactive to light. No scleral icterus.  Neck: Normal range of motion. Neck supple. No tracheal deviation or thyromegaly present.  Cardiovascular:  SR without murmurs or gallops.  No carotid bruits Respiratory: Effort normal.  No respiratory distress. No chest wall tenderness. Breath sounds normal.  No wheezes, rales or rhonchi.  Abdomen:  Right lower quadrant tenderness consistent with appendicitis GU: Musculoskeletal: Normal range of motion. Extremities are nontender. No cyanosis, edema or clubbing noted Lymphadenopathy: No cervical, preauricular, postauricular or axillary adenopathy is present Skin: Skin is warm and dry. No rash noted. No diaphoresis. No erythema. No pallor. Pscyh: Normal mood and affect. Behavior is normal. Judgment and thought content normal.   LABORATORY RESULTS: Results for orders placed during the hospital encounter of 09/17/12 (from the past 48 hour(s))  CBC WITH DIFFERENTIAL     Status: Abnormal   Collection Time    09/17/12 10:15 PM      Result Value Range   WBC 19.9 (*) 4.0 - 10.5 K/uL   RBC  4.79  3.87 - 5.11 MIL/uL   Hemoglobin 14.7  12.0 - 15.0 g/dL   HCT 16.1  09.6 - 04.5 %   MCV 87.5  78.0 - 100.0 fL   MCH 30.7  26.0 - 34.0 pg   MCHC 35.1  30.0 - 36.0 g/dL   RDW 40.9  81.1 - 91.4 %   Platelets 326  150 - 400 K/uL   Neutrophils Relative % 85 (*) 43 - 77 %   Neutro Abs 16.9 (*) 1.7 - 7.7 K/uL   Lymphocytes Relative 8 (*) 12 - 46 %   Lymphs Abs 1.6  0.7 - 4.0 K/uL   Monocytes Relative 7  3 - 12 %   Monocytes Absolute 1.4 (*) 0.1 - 1.0 K/uL   Eosinophils Relative 0  0 - 5 %   Eosinophils Absolute 0.0  0.0 - 0.7 K/uL   Basophils  Relative 0  0 - 1 %   Basophils Absolute 0.0  0.0 - 0.1 K/uL  COMPREHENSIVE METABOLIC PANEL     Status: Abnormal   Collection Time    09/17/12 10:15 PM      Result Value Range   Sodium 132 (*) 135 - 145 mEq/L   Potassium 3.9  3.5 - 5.1 mEq/L   Chloride 98  96 - 112 mEq/L   CO2 23  19 - 32 mEq/L   Glucose, Bld 115 (*) 70 - 99 mg/dL   BUN 11  6 - 23 mg/dL   Creatinine, Ser 7.82  0.50 - 1.10 mg/dL   Calcium 9.2  8.4 - 95.6 mg/dL   Total Protein 6.8  6.0 - 8.3 g/dL   Albumin 3.4 (*) 3.5 - 5.2 g/dL   AST 24  0 - 37 U/L   ALT 25  0 - 35 U/L   Alkaline Phosphatase 42  39 - 117 U/L   Total Bilirubin 1.0  0.3 - 1.2 mg/dL   GFR calc non Af Amer >90  >90 mL/min   GFR calc Af Amer >90  >90 mL/min   Comment: (NOTE)     The eGFR has been calculated using the CKD EPI equation.     This calculation has not been validated in all clinical situations.     eGFR's persistently <90 mL/min signify possible Chronic Kidney     Disease.  LIPASE, BLOOD     Status: None   Collection Time    09/17/12 10:15 PM      Result Value Range   Lipase 29  11 - 59 U/L  URINALYSIS W MICROSCOPIC + REFLEX CULTURE     Status: Abnormal   Collection Time    09/17/12 11:38 PM      Result Value Range   Color, Urine YELLOW  YELLOW   APPearance CLEAR  CLEAR   Specific Gravity, Urine 1.012  1.005 - 1.030   pH 7.0  5.0 - 8.0   Glucose, UA NEGATIVE  NEGATIVE mg/dL   Hgb urine dipstick NEGATIVE  NEGATIVE   Bilirubin Urine NEGATIVE  NEGATIVE   Ketones, ur NEGATIVE  NEGATIVE mg/dL   Protein, ur NEGATIVE  NEGATIVE mg/dL   Urobilinogen, UA 0.2  0.0 - 1.0 mg/dL   Nitrite NEGATIVE  NEGATIVE   Leukocytes, UA TRACE (*) NEGATIVE   WBC, UA 0-2  <3 WBC/hpf   Bacteria, UA RARE  RARE   Squamous Epithelial / LPF RARE  RARE    RADIOLOGY RESULTS: Ct Abdomen Pelvis W Contrast  09/18/2012   CLINICAL DATA:  Right lower quadrant pain  EXAM: CT ABDOMEN AND PELVIS WITH CONTRAST  TECHNIQUE: Multidetector CT imaging of the abdomen and  pelvis was performed using the standard protocol following bolus administration of intravenous contrast.  CONTRAST:  OMNIPAQUE IOHEXOL 300 MG/ML  SOLN  COMPARISON:  None.  FINDINGS: BODY WALL: Unremarkable.  LOWER CHEST:  Mediastinum: Unremarkable.  Lungs/pleura: No consolidation.  ABDOMEN/PELVIS:  Liver: Geographic 2 cm low-attenuation area along the falciform ligament is likely perfusion only or focal fatty infiltration based on location, shape, and delayed imaging appearance.  Biliary: No evidence of biliary obstruction or stone.  Pancreas: Unremarkable.  Spleen: Unremarkable.  Adrenals: Unremarkable.  Kidneys and ureters: No hydronephrosis or stone.  Bladder: Unremarkable.  Bowel: Distended appendix with thick enhancing walls, directed medially and superiorly. The neighboring fat is infiltrated. Along the medial margin and upper margin there is some discontinuous mural enhancement, likely necrosis. No abscess identified. No bowel obstruction.  Retroperitoneum: No mass or adenopathy.  Peritoneum: Reactive free fluid in the low pelvis.  Reproductive: Unremarkable.  Vascular: No acute abnormality.  OSSEOUS: No acute abnormalities. No suspicious lytic or blastic lesions.  CriticalValue/emergent results were called by telephone at the time of interpretation on 09/18/2012 at 12:16 [facility], [place] , who verbally acknowledged these results.  IMPRESSION: Acute appendicitis with mural necrosis.  No abscess.   Electronically Signed   By: Tiburcio Pea   On: 09/18/2012 00:17    Problem List: Patient Active Problem List   Diagnosis Date Noted  . Appendicitis 09/18/2012    Assessment & Plan: Acute appendicitis; Plan laparoscopic/open appendectomy.  Patient given informed consent about plan to admit, IV antibiotics and surgery    Matt B. Daphine Deutscher, MD, [facility] [phone] beeper [phone]  09/18/2012 12:36 AM

## 2012-09-18 NOTE — Transfer of Care (Signed)
Immediate Anesthesia Transfer of Care Note  Patient: Amy [name]  Procedure(s) Performed: Procedure(s): APPENDECTOMY LAPAROSCOPIC (N/A)  Patient Location: PACU  Anesthesia Type:General  Level of Consciousness: awake, oriented, patient cooperative, lethargic and responds to stimulation  Airway & Oxygen Therapy: Patient Spontanous Breathing and Patient connected to face mask oxygen  Post-op Assessment: Report given to PACU RN, Post -op Vital signs reviewed and stable and Patient moving all extremities  Post vital signs: Reviewed and stable  Complications: No apparent anesthesia complications

## 2012-09-18 NOTE — Preoperative (Addendum)
Beta Blockers   Reason not to administer Beta Blockers:Not Applicable 

## 2012-09-18 NOTE — Anesthesia Preprocedure Evaluation (Addendum)
Anesthesia Evaluation  Patient identified by MRN, date of birth, ID band Patient awake    Reviewed: Allergy & Precautions, H&P , NPO status , Patient's Chart, lab work & pertinent test results  Airway Mallampati: II TM Distance: >3 FB Neck ROM: Full    Dental  (+) Dental Advisory Given, Teeth Intact and Caps   Pulmonary neg pulmonary ROS,  breath sounds clear to auscultation        Cardiovascular negative cardio ROS  Rhythm:Regular Rate:Normal     Neuro/Psych  Headaches, negative psych ROS   GI/Hepatic negative GI ROS, Neg liver ROS,   Endo/Other  negative endocrine ROS  Renal/GU negative Renal ROS     Musculoskeletal negative musculoskeletal ROS (+)   Abdominal   Peds  Hematology negative hematology ROS (+)   Anesthesia Other Findings   Reproductive/Obstetrics negative OB ROS                          Anesthesia Physical Anesthesia Plan  ASA: II and emergent  Anesthesia Plan: General   Post-op Pain Management:    Induction: Intravenous and Rapid sequence  Airway Management Planned:   Additional Equipment:   Intra-op Plan:   Post-operative Plan: Extubation in OR  Informed Consent: I have reviewed the patients History and Physical, chart, labs and discussed the procedure including the risks, benefits and alternatives for the proposed anesthesia with the patient or authorized representative who has indicated his/her understanding and acceptance.   Dental advisory given  Plan Discussed with: CRNA  Anesthesia Plan Comments:         Anesthesia Quick Evaluation

## 2012-09-19 LAB — URINE CULTURE

## 2012-09-19 MED ORDER — OXYCODONE-ACETAMINOPHEN 5-325 MG PO TABS: 1.0000 | ORAL_TABLET | Freq: Four times a day (QID) | ORAL | Status: DC | PRN

## 2012-09-19 MED ORDER — IBUPROFEN 600 MG PO TABS
600.0000 mg | ORAL_TABLET | Freq: Once | ORAL | Status: AC
  Administered 2012-09-19: 600 mg via ORAL
  Filled 2012-09-19: qty 1

## 2012-09-19 MED ORDER — OXYCODONE-ACETAMINOPHEN 5-325 MG PO TABS
2.0000 | ORAL_TABLET | Freq: Once | ORAL | Status: AC
  Administered 2012-09-19: 2 via ORAL
  Filled 2012-09-19: qty 2

## 2012-09-19 MED ORDER — IBUPROFEN 600 MG PO TABS: 600.0000 mg | ORAL_TABLET | Freq: Once | ORAL | Status: DC

## 2012-09-19 NOTE — Discharge Summary (Signed)
Agree with above.    D/C home.   

## 2012-09-19 NOTE — Discharge Summary (Signed)
Physician Discharge Summary  Patient ID: Amy [name] MRN: [id number] DOB/AGE: 11-Oct-1965 47 y.o.  Admit date: 09/17/2012 Discharge date: 09/19/2012  Admitting Diagnosis: Acute Appendicitis   Discharge Diagnosis Patient Active Problem List   Diagnosis Date Noted  . Appendicitis 09/18/2012    Consultants none  Imaging: Ct Abdomen Pelvis W Contrast  09/18/2012   CLINICAL DATA:  Right lower quadrant pain  EXAM: CT ABDOMEN AND PELVIS WITH CONTRAST  TECHNIQUE: Multidetector CT imaging of the abdomen and pelvis was performed using the standard protocol following bolus administration of intravenous contrast.  CONTRAST:  OMNIPAQUE IOHEXOL 300 MG/ML  SOLN  COMPARISON:  None.  FINDINGS: BODY WALL: Unremarkable.  LOWER CHEST:  Mediastinum: Unremarkable.  Lungs/pleura: No consolidation.  ABDOMEN/PELVIS:  Liver: Geographic 2 cm low-attenuation area along the falciform ligament is likely perfusion only or focal fatty infiltration based on location, shape, and delayed imaging appearance.  Biliary: No evidence of biliary obstruction or stone.  Pancreas: Unremarkable.  Spleen: Unremarkable.  Adrenals: Unremarkable.  Kidneys and ureters: No hydronephrosis or stone.  Bladder: Unremarkable.  Bowel: Distended appendix with thick enhancing walls, directed medially and superiorly. The neighboring fat is infiltrated. Along the medial margin and upper margin there is some discontinuous mural enhancement, likely necrosis. No abscess identified. No bowel obstruction.  Retroperitoneum: No mass or adenopathy.  Peritoneum: Reactive free fluid in the low pelvis.  Reproductive: Unremarkable.  Vascular: No acute abnormality.  OSSEOUS: No acute abnormalities. No suspicious lytic or blastic lesions.  CriticalValue/emergent results were called by telephone at the time of interpretation on 09/18/2012 at 12:16 [facility], [place] , who verbally acknowledged these results.  IMPRESSION: Acute appendicitis with mural  necrosis.  No abscess.   Electronically Signed   By: Tiburcio Pea   On: 09/18/2012 00:17    Procedures Laparoscopic appendectomy(Dr. Daphine Deutscher 09/18/12)  Hospital Course:   Brande Uncapher is a healthy female who presented to [facility] with acute appendicitis following evaluation at [facility].  Patient was admitted and underwent procedure listed above.  Tolerated procedure well and was transferred to the floor.  Diet was advanced as tolerated.  On POD #1, the patient was voiding well, tolerating diet, ambulating well, pain well controlled, vital signs stable, incisions c/d/i and felt stable for discharge home.  Patient will follow up in our office in 3 weeks and knows to call with questions or concerns.  Physical Exam: General:  Alert, NAD, pleasant, comfortable Abd:  Soft, ND, mild tenderness, incisions C/D/I    Medication List         aspirin 81 MG tablet  Take 81 mg by mouth daily.     calcium carbonate 200 MG capsule  Take by mouth daily.     cetirizine 10 MG tablet  Commonly known as:  ZYRTEC  Take 10 mg by mouth daily.     CRYSELLE-28 0.3-30 MG-MCG tablet  Generic drug:  norgestrel-ethinyl estradiol  Take 1 tablet by mouth daily. CONTINOUSLY FOR 3 MONTHS     eletriptan 40 MG tablet  Commonly known as:  RELPAX  One tablet by mouth at onset of headache.  80mg  total in 24 hours     ibuprofen 600 MG tablet  Commonly known as:  ADVIL,MOTRIN  Take 1 tablet (600 mg total) by mouth once.     multivitamin tablet  Take 1 tablet by mouth daily.     oxyCODONE-acetaminophen 5-325 MG per tablet  Commonly known as:  PERCOCET/ROXICET  Take 1-2 tablets by mouth every 6 (six) hours  as needed for pain.             Follow-up Information   Follow up with [facility] On 10/08/2012. (Your appointment is at 2:30PM, but you need to be there 30 minutes before for check in.)    Contact information:   [address]   [place] [address] [phone]        Signed: Ashok Norris, [facility] [phone]  09/19/2012, 10:03 AM

## 2012-09-20 ENCOUNTER — Encounter (HOSPITAL_COMMUNITY): Payer: Self-pay | Admitting: Surgery

## 2012-09-20 ENCOUNTER — Other Ambulatory Visit (INDEPENDENT_AMBULATORY_CARE_PROVIDER_SITE_OTHER): Payer: Self-pay | Admitting: *Deleted

## 2012-09-20 ENCOUNTER — Telehealth (INDEPENDENT_AMBULATORY_CARE_PROVIDER_SITE_OTHER): Payer: Self-pay | Admitting: *Deleted

## 2012-09-20 MED ORDER — ONDANSETRON 4 MG PO TBDP: 4.0000 mg | ORAL_TABLET | Freq: Four times a day (QID) | ORAL | Status: DC | PRN

## 2012-09-20 NOTE — Telephone Encounter (Signed)
Patient called in this morning to report severe nausea that is preventing her from eating or drinking anything.  Patient had Lap Appy on 09/18/12.  Instructed patient that I will call in per protocol Zofran ODT 4mg  Take 1 tablet every 6 hours as needed for nausea #15 no refills.  Escribing to patient pharmacy at this time.  Patient encouraged to stay on bland diet and small frequent meals until the nausea passes.  Patient states understanding and agreeable at this time.

## 2012-09-23 ENCOUNTER — Ambulatory Visit (INDEPENDENT_AMBULATORY_CARE_PROVIDER_SITE_OTHER): Payer: BC Managed Care – PPO | Admitting: Surgery

## 2012-09-23 ENCOUNTER — Encounter (INDEPENDENT_AMBULATORY_CARE_PROVIDER_SITE_OTHER): Payer: Self-pay | Admitting: Surgery

## 2012-09-23 VITALS — BP 128/74 | HR 68 | Temp 99.2°F | Resp 14 | Ht 65.0 in | Wt 135.6 lb

## 2012-09-23 DIAGNOSIS — K37 Unspecified appendicitis: Secondary | ICD-10-CM

## 2012-09-23 NOTE — Patient Instructions (Signed)
Cover umbilical wound with dry gauze and change twice daily as needed.  May shower.  Velora Heckler, MD, [facility] Office: [phone]

## 2012-09-23 NOTE — Progress Notes (Signed)
General Surgery [facility]  Chief Complaint  Patient presents with  . Post-op Problem    reck appy site/drainage with drainage and odor - patient of Dr. Wenda Low    HISTORY: Patient underwent laparoscopic appendectomy by Dr. Wenda Low on 09/18/2012. Patient has noted drainage from the umbilical wound over the past 48 hours. She presents today for evaluation.  EXAM: Dressings are removed from the 3 abdominal incisions. The umbilical wound has some drainage on the gauze. There does not appear to be any cellulitis. There is no fluctuance. I am unable to express any further drainage from the incision. Remainder of the abdominal examination is benign.  IMPRESSION: Status post laparoscopic appendectomy for acute appendicitis  PLAN: Patient wound is dressed with dry gauze dressing. She will changes twice daily. I've told her she may begin taking daily showers. She will contact us if any further significant drainage occurs. Otherwise she will return to see Dr. Daphine Deutscher for her postoperative visit in 10 days.  Velora Heckler, MD, FACS General & Endocrine Surgery [facility]   Visit Diagnoses: 1. Appendicitis

## 2012-09-27 NOTE — Addendum Note (Signed)
Addendum created 09/27/12 1610 by Gaylan Gerold, MD   Modules edited: Anesthesia Events, Anesthesia Responsible Staff

## 2012-10-08 ENCOUNTER — Encounter (INDEPENDENT_AMBULATORY_CARE_PROVIDER_SITE_OTHER): Payer: Self-pay | Admitting: *Deleted

## 2012-10-08 ENCOUNTER — Ambulatory Visit (INDEPENDENT_AMBULATORY_CARE_PROVIDER_SITE_OTHER): Payer: BC Managed Care – PPO | Admitting: General Surgery

## 2012-10-08 ENCOUNTER — Encounter (INDEPENDENT_AMBULATORY_CARE_PROVIDER_SITE_OTHER): Payer: Self-pay

## 2012-10-08 VITALS — BP 124/70 | HR 64 | Temp 98.5°F | Resp 14 | Ht 65.0 in | Wt 137.0 lb

## 2012-10-08 DIAGNOSIS — K3580 Unspecified acute appendicitis: Secondary | ICD-10-CM

## 2012-10-08 NOTE — Patient Instructions (Signed)
Follow up as needed. May return to full physical activity 6 weeks post op

## 2012-10-08 NOTE — Progress Notes (Signed)
Amy [name] 1965-04-23 [id number] 10/08/2012   History of Present Illness: Amy [name] is a  46 y.o. female who presents today status post lap appy by Dr. Daphine Deutscher.  Pathology reveals acute suppurative appendicitis.  The patient is tolerating a regular diet, having normal bowel movements, has good pain control.  At night mostly, she is still having some soreness around mostly her umbilicus that is relived by advit.  She  is back to most normal activities.   Physical Exam: Abd: soft, nontender, active bowel sounds, nondistended.  All incisions are well healed.  Impression: 1.  Acute appendicitis, s/p lap appy  Plan: She  is able to return to normal activities, full physical training at 6 weeks. She  may follow up on a prn basis.

## 2012-11-10 ENCOUNTER — Ambulatory Visit (INDEPENDENT_AMBULATORY_CARE_PROVIDER_SITE_OTHER): Payer: BC Managed Care – PPO | Admitting: Family Medicine

## 2012-11-10 VITALS — BP 110/70 | HR 73 | Temp 98.2°F | Resp 16 | Ht 65.0 in | Wt 137.0 lb

## 2012-11-10 DIAGNOSIS — I809 Phlebitis and thrombophlebitis of unspecified site: Secondary | ICD-10-CM

## 2012-11-10 NOTE — Progress Notes (Addendum)
This chart was scribed for Amy Staggers, MD by [name], Medical Scribe. This patient was seen in Room/bed 5 and the patient's care was started at 10:53 AM.  Subjective:    Patient ID: Amy [name], female    DOB: 09-03-1965, 47 y.o.   MRN: [id number]  HPI HPI Comments: BRONWEN [name] is a 47 y.o. female who presents to [name] complaining of right arm pain. Pt states that she had an IV placed in her right arm for appendix surgery on 09/18/12. She states that the IV site didn't hurt in the hospital. After discharge she noticed arm discomfort on the underside of her arm near her elbow where the IV was. When pt went back for recheck after surgery she was still having discomfort, but her doctor told her she wouldn't worry about it. The pain had improved afterwards for about one week. This morning pt noticed discomfort and an enlarged area to the underside of her right arm. Pt reports soreness in the same area. Pt has not used heat on the area. She has not tried any medications. Pt denies fever, chest pain, difficulty breathing, no bumps on her lower extremities. 09/03/2012 Pt had blood clot in her left foot - superficial thrombophlebitis.   Patient Active Problem List   Diagnosis Date Noted  . Appendicitis 09/18/2012   Past Medical History  Diagnosis Date  . Migraine aura without headache    Past Surgical History  Procedure Laterality Date  . Cervical biopsy  w/ loop electrode excision  2001    no further procedures needed  . Laparoscopic appendectomy N/A 09/18/2012    Procedure: APPENDECTOMY LAPAROSCOPIC;  Surgeon: Valarie Merino, MD;  Location: [facility]S;  Service: General;  Laterality: N/A;  . Appendectomy     Allergies  Allergen Reactions  . Penicillins Hives   Prior to Admission medications   Medication Sig Start Date End Date Taking? Authorizing Provider  aspirin 81 MG tablet Take 81 mg by mouth daily.   Yes Historical Provider, MD  calcium carbonate 200 MG capsule Take by  mouth daily.    Yes Historical Provider, MD  cetirizine (ZYRTEC) 10 MG tablet Take 10 mg by mouth daily.   Yes Historical Provider, MD  eletriptan (RELPAX) 40 MG tablet One tablet by mouth at onset of headache.  80mg  total in 24 hours 08/23/12  Yes Gwenlyn Found Copland, MD  ibuprofen (ADVIL,MOTRIN) 600 MG tablet Take 1 tablet (600 mg total) by mouth once. 09/19/12  Yes Emina Riebock, NP  Multiple Vitamin (MULTIVITAMIN) tablet Take 1 tablet by mouth daily.   Yes Historical Provider, MD  norgestrel-ethinyl estradiol (CRYSELLE-28) 0.3-30 MG-MCG tablet Take 1 tablet by mouth daily. CONTINOUSLY FOR 3 MONTHS   Yes Historical Provider, MD   History   Social History  . Marital Status: Single    Spouse Name: N/A    Number of Children: N/A  . Years of Education: N/A   Occupational History  . Not on file.   Social History Main Topics  . Smoking status: Never Smoker   . Smokeless tobacco: Never Used  . Alcohol Use: Yes     Comment: very occasionally  . Drug Use: No  . Sexual Activity: Yes    Birth Control/ Protection: Pill   Other Topics Concern  . Not on file   Social History Narrative  . No narrative on file   Review of Systems  Constitutional: Negative for fever.  Respiratory: Negative for shortness of breath.   Cardiovascular:  Negative for chest pain.  Musculoskeletal: Positive for arthralgias (Right arm).       Objective:   Physical Exam  Nursing note and vitals reviewed. Constitutional: She is oriented to person, place, and time. She appears well-developed and well-nourished. No distress.  HENT:  Head: Normocephalic and atraumatic.  Eyes: EOM are normal.  Neck: Neck supple. No tracheal deviation present.  Cardiovascular: Normal rate, regular rhythm and normal heart sounds.  Exam reveals no gallop and no friction rub.   No murmur heard. Pulmonary/Chest: Effort normal and breath sounds normal. No respiratory distress. She has no wheezes. She has no rales. She exhibits no  tenderness.  Musculoskeletal: Normal range of motion. She exhibits tenderness.  Elbow full extension full flexion. Full pronation and supination. Neurovascularly intact distally. No change in pain with grip strength. No pain with resisted pronation or supination. Medial upper forearm just distal to the medial epicondyle firm mobile linear structure 4cm in length. Hand veins flatten with arm elevation, no axillary ttp or LAD.  Negative Homan's at the calf. Calves are non tender without edema.   Neurological: She is alert and oriented to person, place, and time.  Skin: Skin is warm and dry.  Psychiatric: She has a normal mood and affect. Her behavior is normal.    Filed Vitals:   11/10/12 1025  BP: 110/70  Pulse: 73  Temp: 98.2 F (36.8 C)  TempSrc: Oral  Resp: 16  Height: 5\' 5"  (1.651 m)  Weight: 137 lb (62.143 kg)  SpO2: 99%      Assessment & Plan:  I personally performed the services described in this documentation, which was scribed in my presence. The recorded information has been reviewed and considered, and addended by me as needed.   Amy [name] is a 47 y.o. female Superficial thrombophlebitis suspect from IV in arm and relative immobility. Apply heat, antiinflammatory po, and recheck next few weeks if not improving.  rtc precautions given.  If recurs - consider hypercoag workup.    No orders of the defined types were placed in this encounter.   Patient Instructions  Work on heat to area, advil over the counter, and if not improving in next week to two - recheck.  Return to the clinic or go to the nearest emergency room if any of your symptoms worsen or new symptoms occur. Phlebitis Phlebitis is a redness, tenderness and soreness (inflammation) in a vein. This can occur in your arms, legs, or torso (trunk), as well as deeper inside your body.  CAUSES  Phlebitis can be triggered by multiple factors. These include:  Reduced (restricted) blood flow through your veins.  This happens with prolonged bed rest, long distance travel, injury or surgery. Being overweight (obese) and pregnant can also restrict blood flow and lead to phlebitis.  Putting a catheter in the vein (intravenous or IV) and giving certain medications through in the vein (intravenously).  Cancer and cancer treatment.  Use of illegal intravenous drugs.  Inflammatory diseases.  Inherited (genetic) diseases that increase the risk for blood clots.  Hormone therapy (such as birth control pills). SYMPTOMS   Red, tender, swollen, painful area on your skin.  Usually, the area will be long and narrow.  Low grade fever.  Significant firmness along the center of this area. This can indicate that a blood clot has formed.  Surrounding redness or a high fever, which can indicate an infection (cellulitis). DIAGNOSIS   The appearance of your condition and your symptoms will cause your  caregiver to suspect phlebitis. Usually, this is enough for a diagnosis.  Your caregiver may request blood tests or an ultrasound test of the area to be sure you do not have an infection or a blood clot. Blood tests and discussing your family history may also indicate if you have an underlying genetic disease that causes blood clots.  Occasionally, a piece of tissue is taken from the body (biopsy) if an unusual cause of phlebitis is suspected. TREATMENT   Raise (elevate) the affected area above the level of the heart.  Apply a warm compress or heating pad for 20 minutes, 3 or 4 times a day. If you use an electric heating pad, follow the directions so you do not burn yourself.  Anti-inflammatory medications are usually recommended. Follow your caregiver's directions.  Any IV catheter, if present, will be removed by your caregiver.  Your caregiver may prescribe medicines that kill germs (antibiotics) if an infection is present.  Your caregiver may recommend blood thinners if a blood clot is suspected or  present.  Support stockings or bandages may be helpful, depending on the cause and location of the phlebitis.  Surgery may be needed to remove very damaged sections of vein, but this is rare. HOME CARE INSTRUCTIONS   Take medications exactly as prescribed.  Follow up with your caregiver as directed.  Use support stockings or bandages if advised. These will speed healing and prevent recurrence.  If you are on blood thinners:  Do follow-up blood tests exactly as directed.  Check with your caregiver before using any new medications.  Wear a pendant to show that you are on blood thinners.  For phlebitis in the legs:  Avoid prolonged standing or bed rest.  Keep your legs moving. Raise your legs with sitting or lying.  Do not smoke.  Women, particularly those over the age of 31, should consider the risks and benefits of taking the contraceptive pill. This kind of hormone treatment can increase your risk for blood clots. SEEK MEDICAL CARE IF:   You have unusual bruising or any bleeding problems.  Swelling or pain in your affected arm or leg is not gradually improving.  You are on anti-inflammatory medication and you develop belly (abdominal) pain. SEEK IMMEDIATE MEDICAL CARE IF:   An unexplained oral temperature above 100.5 F (38.1 C) develops.  You have sudden onset of chest pain or difficulty breathing. Document Released: 12/13/2000 Document Revised: 03/13/2011 Document Reviewed: 09/14/2008 [facility] Patient Information 2014 [place], [place].

## 2012-11-10 NOTE — Patient Instructions (Signed)
Work on heat to area, advil over the counter, and if not improving in next week to two - recheck.  Return to the clinic or go to the nearest emergency room if any of your symptoms worsen or new symptoms occur. Phlebitis Phlebitis is a redness, tenderness and soreness (inflammation) in a vein. This can occur in your arms, legs, or torso (trunk), as well as deeper inside your body.  CAUSES  Phlebitis can be triggered by multiple factors. These include:  Reduced (restricted) blood flow through your veins. This happens with prolonged bed rest, long distance travel, injury or surgery. Being overweight (obese) and pregnant can also restrict blood flow and lead to phlebitis.  Putting a catheter in the vein (intravenous or IV) and giving certain medications through in the vein (intravenously).  Cancer and cancer treatment.  Use of illegal intravenous drugs.  Inflammatory diseases.  Inherited (genetic) diseases that increase the risk for blood clots.  Hormone therapy (such as birth control pills). SYMPTOMS   Red, tender, swollen, painful area on your skin.  Usually, the area will be long and narrow.  Low grade fever.  Significant firmness along the center of this area. This can indicate that a blood clot has formed.  Surrounding redness or a high fever, which can indicate an infection (cellulitis). DIAGNOSIS   The appearance of your condition and your symptoms will cause your caregiver to suspect phlebitis. Usually, this is enough for a diagnosis.  Your caregiver may request blood tests or an ultrasound test of the area to be sure you do not have an infection or a blood clot. Blood tests and discussing your family history may also indicate if you have an underlying genetic disease that causes blood clots.  Occasionally, a piece of tissue is taken from the body (biopsy) if an unusual cause of phlebitis is suspected. TREATMENT   Raise (elevate) the affected area above the level of the  heart.  Apply a warm compress or heating pad for 20 minutes, 3 or 4 times a day. If you use an electric heating pad, follow the directions so you do not burn yourself.  Anti-inflammatory medications are usually recommended. Follow your caregiver's directions.  Any IV catheter, if present, will be removed by your caregiver.  Your caregiver may prescribe medicines that kill germs (antibiotics) if an infection is present.  Your caregiver may recommend blood thinners if a blood clot is suspected or present.  Support stockings or bandages may be helpful, depending on the cause and location of the phlebitis.  Surgery may be needed to remove very damaged sections of vein, but this is rare. HOME CARE INSTRUCTIONS   Take medications exactly as prescribed.  Follow up with your caregiver as directed.  Use support stockings or bandages if advised. These will speed healing and prevent recurrence.  If you are on blood thinners:  Do follow-up blood tests exactly as directed.  Check with your caregiver before using any new medications.  Wear a pendant to show that you are on blood thinners.  For phlebitis in the legs:  Avoid prolonged standing or bed rest.  Keep your legs moving. Raise your legs with sitting or lying.  Do not smoke.  Women, particularly those over the age of 53, should consider the risks and benefits of taking the contraceptive pill. This kind of hormone treatment can increase your risk for blood clots. SEEK MEDICAL CARE IF:   You have unusual bruising or any bleeding problems.  Swelling or pain in  your affected arm or leg is not gradually improving.  You are on anti-inflammatory medication and you develop belly (abdominal) pain. SEEK IMMEDIATE MEDICAL CARE IF:   An unexplained oral temperature above 100.5 F (38.1 C) develops.  You have sudden onset of chest pain or difficulty breathing. Document Released: 12/13/2000 Document Revised: 03/13/2011 Document  Reviewed: 09/14/2008 [facility] Patient Information 2014 [place], [place].

## 2014-01-05 ENCOUNTER — Other Ambulatory Visit: Payer: Self-pay | Admitting: Obstetrics and Gynecology

## 2014-01-08 LAB — CYTOLOGY - PAP

## 2014-02-19 IMAGING — CT CT ABD-PELV W/ CM
1 of 3 series · 14 of 32 positions shown, 19 images · IV contrast (OMNIPAQUE 300)
Comparison: None.

CLINICAL DATA: Right lower quadrant pain

EXAM:
CT ABDOMEN AND PELVIS WITH CONTRAST
TECHNIQUE: Multidetector CT imaging of the abdomen and pelvis was performed
using the standard protocol following bolus administration of
intravenous contrast.
CONTRAST:  100mL OMNIPAQUE IOHEXOL 300 MG/ML  SOLN

[Series 2: abd/pel with · axial · 0.74mm/px · z∈[-521,-131]mm · 14 of 88 slices shown, 19 images]
[im 5/88  soft-tissue]
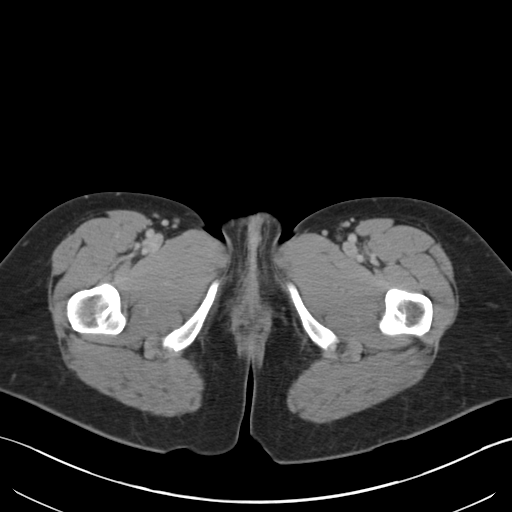
[im 5/88  bone]
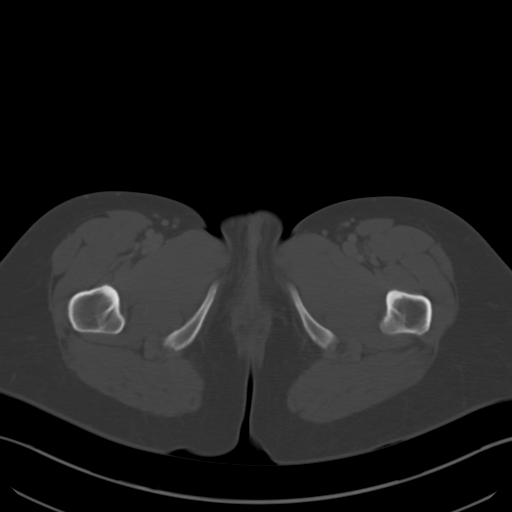
[im 14/88  soft-tissue]
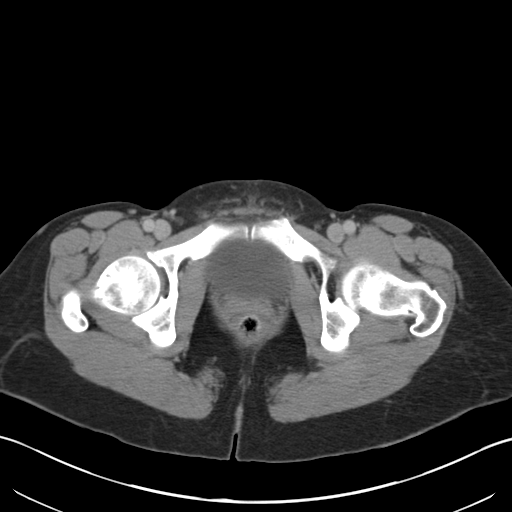
[im 19/88  soft-tissue]
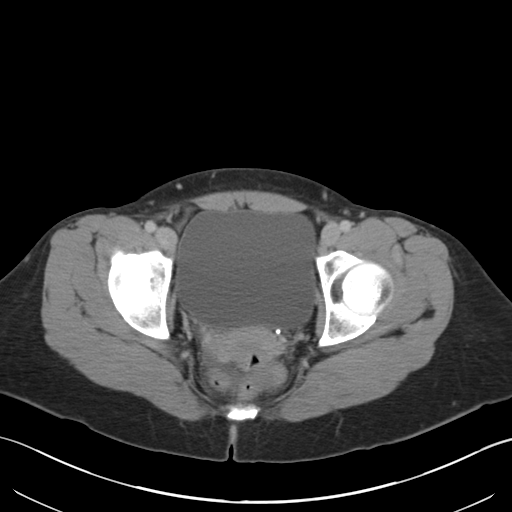
[im 23/88  soft-tissue]
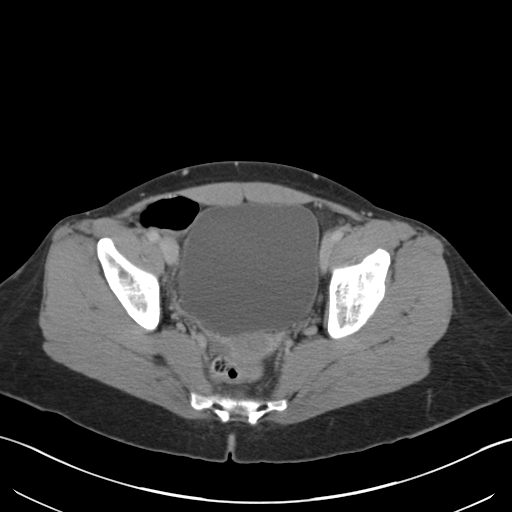
[im 33/88  soft-tissue]
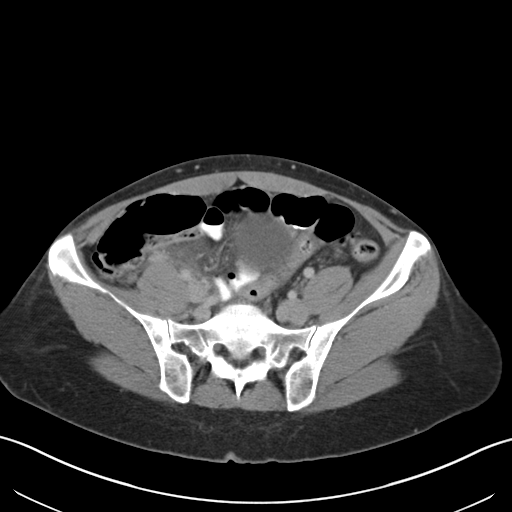
[im 37/88  soft-tissue]
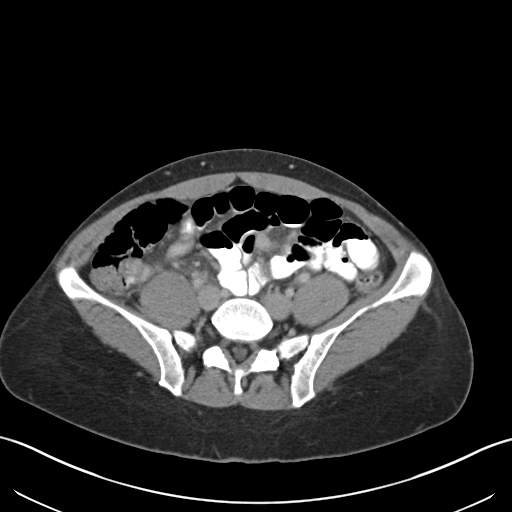
[im 46/88  soft-tissue]
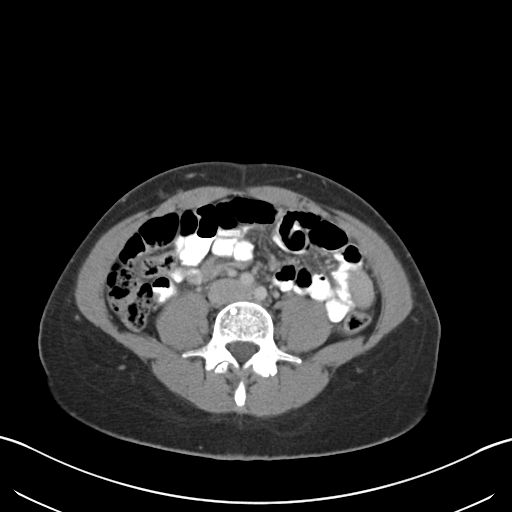
[im 51/88  soft-tissue]
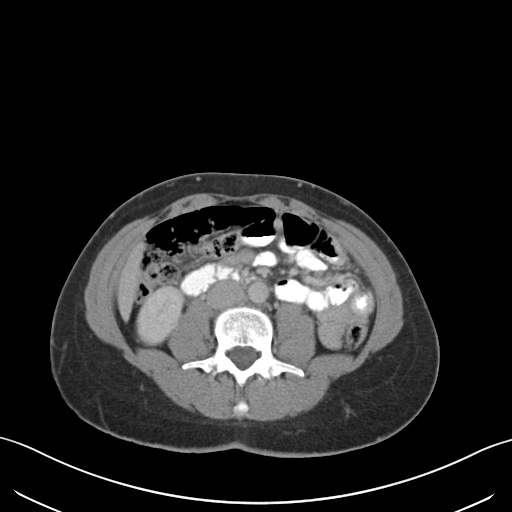
[im 55/88  soft-tissue]
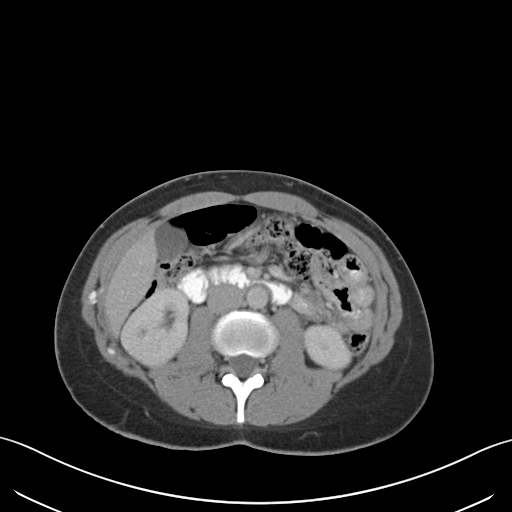
[im 55/88  bone]
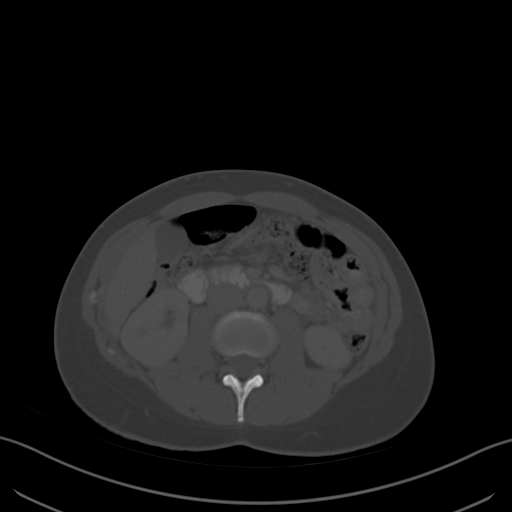
[im 65/88  soft-tissue]
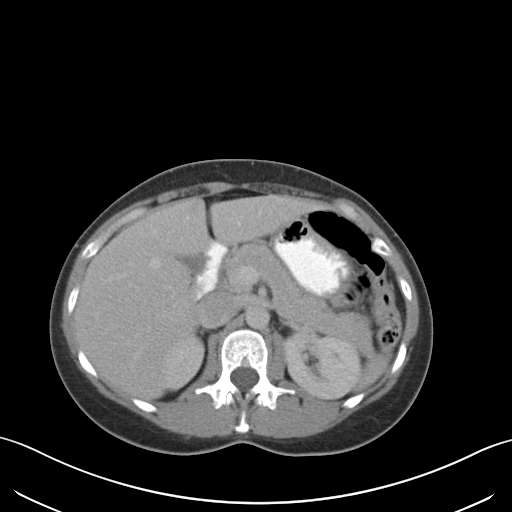
[im 69/88  soft-tissue]
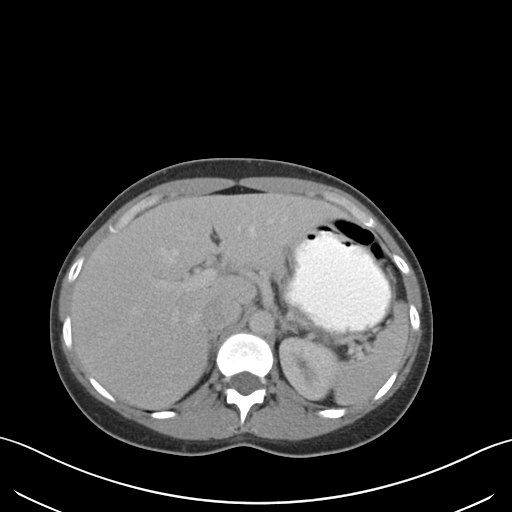
[im 69/88  lung]
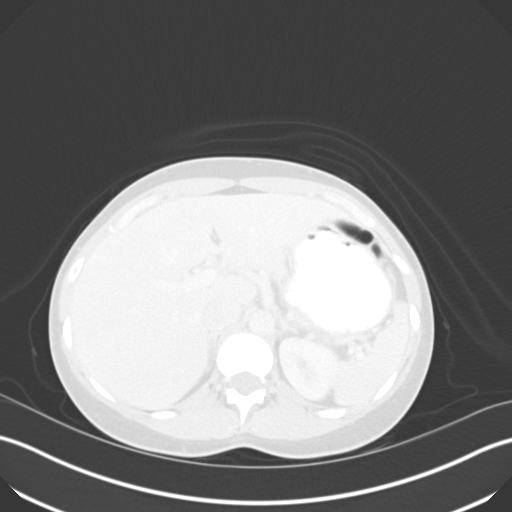
[im 74/88  soft-tissue]
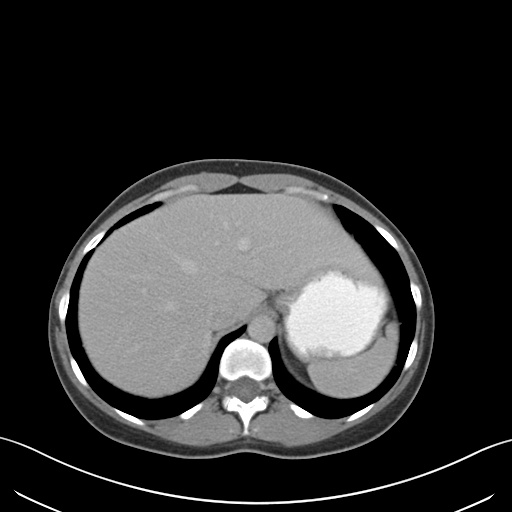
[im 74/88  lung]
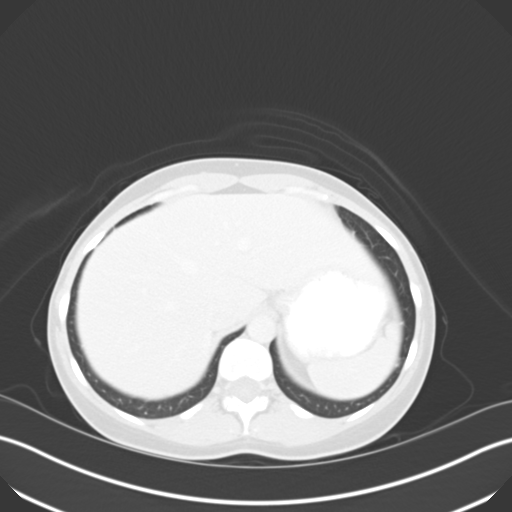
[im 78/88  lung]
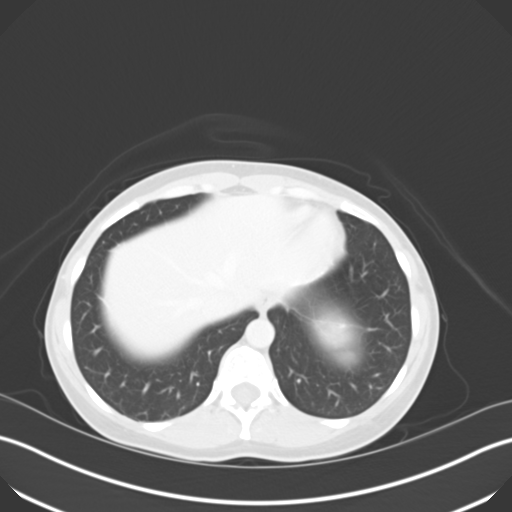
[im 83/88  soft-tissue]
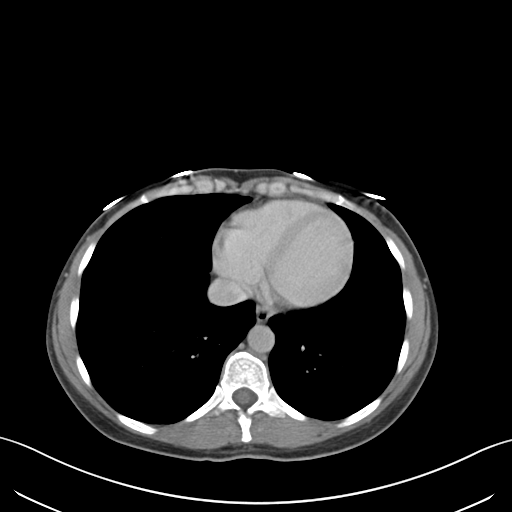
[im 83/88  lung]
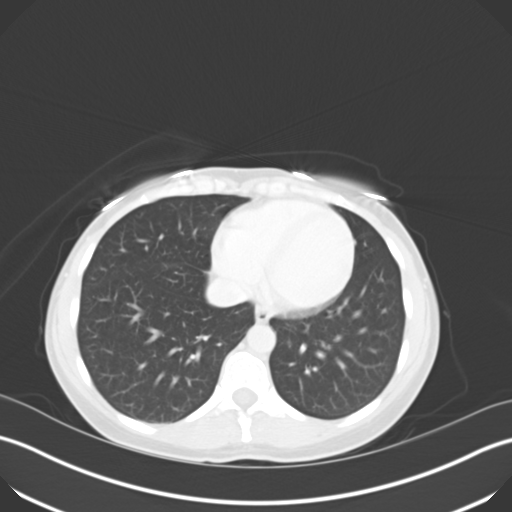

[14 of 32 positions shown; findings below may reference images not displayed]

FINDINGS: BODY WALL: Unremarkable.

LOWER CHEST:

Mediastinum: Unremarkable.

Lungs/pleura: No consolidation.

ABDOMEN/PELVIS:

Liver: Geographic 2 cm low-attenuation area along the falciform
ligament is likely perfusion only or focal fatty infiltration based
on location, shape, and delayed imaging appearance.

Biliary: No evidence of biliary obstruction or stone.

Pancreas: Unremarkable.

Spleen: Unremarkable.

Adrenals: Unremarkable.

Kidneys and ureters: No hydronephrosis or stone.

Bladder: Unremarkable.

Bowel: Distended appendix with thick enhancing walls, directed
medially and superiorly. The neighboring fat is infiltrated. Along
the medial margin and upper margin there is some discontinuous mural
enhancement, likely necrosis. No abscess identified. No bowel
obstruction.

Retroperitoneum: No mass or adenopathy.

Peritoneum: Reactive free fluid in the low pelvis.

Reproductive: Unremarkable.

Vascular: No acute abnormality.

OSSEOUS: No acute abnormalities. No suspicious lytic or blastic
lesions.

CriticalValue/emergent results were called by telephone at the time
of interpretation on 09/18/2012 at [DATE] [place], [place] , who
verbally acknowledged these results.
IMPRESSION: Acute appendicitis with mural necrosis.  No abscess.

## 2015-03-28 ENCOUNTER — Ambulatory Visit (INDEPENDENT_AMBULATORY_CARE_PROVIDER_SITE_OTHER): Payer: BC Managed Care – PPO | Admitting: Family Medicine

## 2015-03-28 VITALS — BP 136/88 | HR 113 | Temp 98.0°F | Resp 18 | Ht 66.5 in | Wt 130.0 lb

## 2015-03-28 DIAGNOSIS — R059 Cough, unspecified: Secondary | ICD-10-CM

## 2015-03-28 DIAGNOSIS — R05 Cough: Secondary | ICD-10-CM

## 2015-03-28 DIAGNOSIS — R509 Fever, unspecified: Secondary | ICD-10-CM | POA: Diagnosis not present

## 2015-03-28 DIAGNOSIS — J111 Influenza due to unidentified influenza virus with other respiratory manifestations: Secondary | ICD-10-CM

## 2015-03-28 LAB — POCT INFLUENZA A/B
INFLUENZA B, POC: POSITIVE — AB
Influenza A, POC: NEGATIVE

## 2015-03-28 MED ORDER — OSELTAMIVIR PHOSPHATE 75 MG PO CAPS: 75.0000 mg | ORAL_CAPSULE | Freq: Two times a day (BID) | ORAL | Status: AC

## 2015-03-28 MED ORDER — BENZONATATE 100 MG PO CAPS: 100.0000 mg | ORAL_CAPSULE | Freq: Three times a day (TID) | ORAL | Status: AC | PRN

## 2015-03-28 MED ORDER — HYDROCODONE-HOMATROPINE 5-1.5 MG/5ML PO SYRP: ORAL_SOLUTION | ORAL | Status: AC

## 2015-03-28 NOTE — Patient Instructions (Addendum)
IF you received an x-ray today, you will receive an invoice from [facility]. Please contact [facility] at [phone] with questions or concerns regarding your invoice.   IF you received labwork today, you will receive an invoice from [employer]. Please contact [facility] at [phone] with questions or concerns regarding your invoice.   Our billing staff will not be able to assist you with questions regarding bills from these companies.  You will be contacted with the lab results as soon as they are available. The fastest way to get your results is to activate your My Chart account. Instructions are located on the last page of this paperwork. If you have not heard from [place] regarding the results in 2 weeks, please contact this office.    Can start Tamiflu as discussed. Saline nasal spray at least 4 times per day if needed for nasal congestion, over the counter Tessalon or mucinex DM as needed for cough, tylenol or ibuprofen over the counter for fever and body aches, and drink plenty of fluids. Hydrocodone cough syrup at night if needed. Other information as in instructions below.  Return to the clinic or go to the nearest emergency room if any of your symptoms worsen or new symptoms occur.   Influenza, Adult Influenza ("the flu") is a viral infection of the respiratory tract. It occurs more often in winter months because people spend more time in close contact with one another. Influenza can make you feel very sick. Influenza easily spreads from person to person (contagious). CAUSES  Influenza is caused by a virus that infects the respiratory tract. You can catch the virus by breathing in droplets from an infected person's cough or sneeze. You can also catch the virus by touching something that was recently contaminated with the virus and then touching your mouth, nose, or eyes. RISKS AND COMPLICATIONS You may be at risk for a more severe case of  influenza if you smoke cigarettes, have diabetes, have chronic heart disease (such as heart failure) or lung disease (such as asthma), or if you have a weakened immune system. Elderly people and pregnant women are also at risk for more serious infections. The most common problem of influenza is a lung infection (pneumonia). Sometimes, this problem can require emergency medical care and may be life threatening. SIGNS AND SYMPTOMS  Symptoms typically last 4 to 10 days and may include:  Fever.  Chills.  Headache, body aches, and muscle aches.  Sore throat.  Chest discomfort and cough.  Poor appetite.  Weakness or feeling tired.  Dizziness.  Nausea or vomiting. DIAGNOSIS  Diagnosis of influenza is often made based on your history and a physical exam. A nose or throat swab test can be done to confirm the diagnosis. TREATMENT  In mild cases, influenza goes away on its own. Treatment is directed at relieving symptoms. For more severe cases, your health care provider may prescribe antiviral medicines to shorten the sickness. Antibiotic medicines are not effective because the infection is caused by a virus, not by bacteria. HOME CARE INSTRUCTIONS  Take medicines only as directed by your health care provider.  Use a cool mist humidifier to make breathing easier.  Get plenty of rest until your temperature returns to normal. This usually takes 3 to 4 days.  Drink enough fluid to keep your urine clear or pale yellow.  Cover yourmouth and nosewhen coughing or sneezing,and wash your handswellto prevent thevirusfrom spreading.  Stay homefromwork orschool untilthe fever is gonefor at least  111full day. PREVENTION  An annual influenza vaccination (flu shot) is the best way to avoid getting influenza. An annual flu shot is now routinely recommended for all adults in the [place] SEEK MEDICAL CARE IF:  You experiencechest pain, yourcough worsens,or you producemore mucus.  Youhave  nausea,vomiting, ordiarrhea.  Your fever returns or gets worse. SEEK IMMEDIATE MEDICAL CARE IF:  You havetrouble breathing, you become short of breath,or your skin ornails becomebluish.  You have severe painor stiffnessin the neck.  You develop a sudden headache, or pain in the face or ear.  You have nausea or vomiting that you cannot control. MAKE SURE YOU:   Understand these instructions.  Will watch your condition.  Will get help right away if you are not doing well or get worse.   This information is not intended to replace advice given to you by your health care provider. Make sure you discuss any questions you have with your health care provider.   Document Released: 12/17/1999 Document Revised: 01/09/2014 Document Reviewed: 03/20/2011 Elsevier Interactive Patient Education [employer]2016 Elsevier Inc.

## 2015-03-28 NOTE — Progress Notes (Signed)
Subjective:    Patient ID: Amy [name], female    DOB: 08-20-1965, 50 y.o.   MRN: [id number] By signing my name below, I, Littie Deeds, attest that this documentation has been prepared under the direction and in the presence of [name], MD.  Electronically Signed: Littie Deeds, Medical Scribe. 03/28/2015. 9:51 AM.  HPI HPI Comments: Amy [name] is a 50 y.o. female who presents to the [facility] complaining of gradual onset, worsening dry cough that started 2 days ago. Patient reports having associated fatigue, mild headache, congestion, aching ear pain, sore throat, chills, and fever with temperature 101.2 F yesterday. She did force some sputum with her cough and noticed a small amount of blood in it. She tried Delsym but without relief. She did take an Advil yesterday, but she has not taken any NSAIDs today. Patient denies myalgias. 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 Her family members are not ill. She did receive the flu shot this year.  Patient Active Problem List   Diagnosis Date Noted  . Appendicitis 09/18/2012   Past Medical History  Diagnosis Date  . Migraine aura without headache    Past Surgical History  Procedure Laterality Date  . Cervical biopsy  w/ loop electrode excision  2001    no further procedures needed  . Laparoscopic appendectomy N/A 09/18/2012    Procedure: APPENDECTOMY LAPAROSCOPIC;  Surgeon: Valarie Merino, MD;  Location: [facility];  Service: General;  Laterality: N/A;  . Appendectomy     Allergies  Allergen Reactions  . Penicillins Hives   Prior to Admission medications   Medication Sig Start Date End Date Taking? Authorizing Provider  cetirizine (ZYRTEC) 10 MG tablet Take 10 mg by mouth daily.   Yes Historical Provider, MD  fluticasone (FLONASE) 50 MCG/ACT nasal spray Place into both nostrils daily.   Yes Historical Provider, MD  norgestrel-ethinyl estradiol (CRYSELLE-28) 0.3-30 MG-MCG tablet Take 1  tablet by mouth daily. CONTINOUSLY FOR 3 MONTHS   Yes Historical Provider, MD  aspirin 81 MG tablet Take 81 mg by mouth daily. Reported on 03/28/2015    Historical Provider, MD  calcium carbonate 200 MG capsule Take by mouth daily. Reported on 03/28/2015    Historical Provider, MD  eletriptan (RELPAX) 40 MG tablet One tablet by mouth at onset of headache.   total in 24 hours Patient not taking: Reported on 03/28/2015 08/23/12   Gwenlyn Found Copland, MD  ibuprofen (ADVIL,MOTRIN) 600 MG tablet Take 1 tablet (600 mg total) by mouth once. Patient not taking: Reported on 03/28/2015 09/19/12   Ashok Norris, NP  Multiple Vitamin (MULTIVITAMIN) tablet Take 1 tablet by mouth daily. Reported on 03/28/2015    Historical Provider, MD   Social History   Social History  . Marital Status: Single    Spouse Name: N/A  . Number of Children: N/A  . Years of Education: N/A   Occupational History  . Not on file.   Social History Main Topics  . Smoking status: Never Smoker   . Smokeless tobacco: Never Used  . Alcohol Use: Yes     Comment: very occasionally  . Drug Use: No  . Sexual Activity: Yes    Birth Control/ Protection: Pill   Other Topics Concern  . Not on file   Social History Narrative     Review of Systems  Constitutional: Positive for fever, chills and fatigue.  HENT: Positive for congestion and ear pain.  Respiratory: Positive for cough.   Musculoskeletal: Negative for myalgias.  Neurological: Positive for headaches.       Objective:   Physical Exam  Constitutional: She is oriented to person, place, and time. She appears well-developed and well-nourished. No distress.  HENT:  Head: Normocephalic and atraumatic.  Right Ear: Hearing, tympanic membrane, external ear and ear canal normal.  Left Ear: Hearing, tympanic membrane, external ear and ear canal normal.  Nose: Nose normal.  Mouth/Throat: Oropharynx is clear and moist. No oropharyngeal exudate.  Eyes: Conjunctivae and EOM are  normal. Pupils are equal, round, and reactive to light.  Cardiovascular: Normal rate, regular rhythm, normal heart sounds and intact distal pulses.   No murmur heard. Pulmonary/Chest: Effort normal and breath sounds normal. No respiratory distress. She has no wheezes. She has no rhonchi.  Clear to auscultation bilaterally.   Neurological: She is alert and oriented to person, place, and time.  Skin: Skin is warm and dry. No rash noted.  Psychiatric: She has a normal mood and affect. Her behavior is normal.  Vitals reviewed.   Filed Vitals:   03/28/15 0937  BP: 136/88  Pulse: 113  Temp: 98 F (36.7 C)  Resp: 18  Height: 5' 6.5" (1.689 m)  Weight: 130 lb (58.968 kg)  SpO2: 98%    Results for orders placed or performed in visit on 03/28/15  POCT Influenza A/B  Result Value Ref Range   Influenza A, POC Negative Negative   Influenza B, POC Positive (A) Negative        Assessment & Plan:   Amy [name] is a 50 y.o. female Cough - Plan: POCT Influenza A/B, benzonatate (TESSALON) 100 MG capsule, HYDROcodone-homatropine (HYCODAN) 5-1.5 MG/5ML syrup  Fever, unspecified - Plan: POCT Influenza A/B  Influenza with respiratory manifestation - Plan: oseltamivir (TAMIFLU) 75 MG capsule  Influenza B. May still be in the time period for Tamiflu. Tamiflu prescription given, symptomatic care discussed with Mucinex or Tessalon during the day, Hycodan at night if needed. Out of work note until fever free for 24 hours. RTC precautions.   Meds ordered this encounter  Medications  . fluticasone (FLONASE) 50 MCG/ACT nasal spray    Sig: Place into both nostrils daily.  Marland Kitchen oseltamivir (TAMIFLU) 75 MG capsule    Sig: Take 1 capsule (75 mg total) by mouth 2 (two) times daily.    Dispense:  10 capsule    Refill:  0  . benzonatate (TESSALON) 100 MG capsule    Sig: Take 1 capsule (100 mg total) by mouth 3 (three) times daily as needed for cough.    Dispense:  20 capsule    Refill:  0  .  HYDROcodone-homatropine (HYCODAN) 5-1.5 MG/5ML syrup    Sig: 105m by mouth a bedtime as needed for cough.    Dispense:  120 mL    Refill:  0   Patient Instructions    IF you received an x-ray today, you will receive an invoice from [facility]. Please contact [facility] at [phone] with questions or concerns regarding your invoice.   IF you received labwork today, you will receive an invoice from [employer]. Please contact [facility] at [phone] with questions or concerns regarding your invoice.   Our billing staff will not be able to assist you with questions regarding bills from these companies.  You will be contacted with the lab results as soon as they are available. The fastest way to get your results is to activate your  My Chart account. Instructions are located on the last page of this paperwork. If you have not heard from us regarding the results in 2 weeks, please contact this office.    Can start Tamiflu as discussed. Saline nasal spray at least 4 times per day if needed for nasal congestion, over the counter Tessalon or mucinex DM as needed for cough, tylenol or ibuprofen over the counter for fever and body aches, and drink plenty of fluids. Hydrocodone cough syrup at night if needed. Other information as in instructions below.  Return to the clinic or go to the nearest emergency room if any of your symptoms worsen or new symptoms occur.   Influenza, Adult Influenza ("the flu") is a viral infection of the respiratory tract. It occurs more often in winter months because people spend more time in close contact with one another. Influenza can make you feel very sick. Influenza easily spreads from person to person (contagious). CAUSES  Influenza is caused by a virus that infects the respiratory tract. You can catch the virus by breathing in droplets from an infected person's cough or sneeze. You can also catch the virus by touching  something that was recently contaminated with the virus and then touching your mouth, nose, or eyes. RISKS AND COMPLICATIONS You may be at risk for a more severe case of influenza if you smoke cigarettes, have diabetes, have chronic heart disease (such as heart failure) or lung disease (such as asthma), or if you have a weakened immune system. Elderly people and pregnant women are also at risk for more serious infections. The most common problem of influenza is a lung infection (pneumonia). Sometimes, this problem can require emergency medical care and may be life threatening. SIGNS AND SYMPTOMS  Symptoms typically last 4 to 10 days and may include:  Fever.  Chills.  Headache, body aches, and muscle aches.  Sore throat.  Chest discomfort and cough.  Poor appetite.  Weakness or feeling tired.  Dizziness.  Nausea or vomiting. DIAGNOSIS  Diagnosis of influenza is often made based on your history and a physical exam. A nose or throat swab test can be done to confirm the diagnosis. TREATMENT  In mild cases, influenza goes away on its own. Treatment is directed at relieving symptoms. For more severe cases, your health care provider may prescribe antiviral medicines to shorten the sickness. Antibiotic medicines are not effective because the infection is caused by a virus, not by bacteria. HOME CARE INSTRUCTIONS  Take medicines only as directed by your health care provider.  Use a cool mist humidifier to make breathing easier.  Get plenty of rest until your temperature returns to normal. This usually takes 3 to 4 days.  Drink enough fluid to keep your urine clear or pale yellow.  Cover yourmouth and nosewhen coughing or sneezing,and wash your handswellto prevent thevirusfrom spreading.  Stay homefromwork orschool untilthe fever is gonefor at least 631full day. PREVENTION  An annual influenza vaccination (flu shot) is the best way to avoid getting influenza. An annual flu  shot is now routinely recommended for all adults in the [place] SEEK MEDICAL CARE IF:  You experiencechest pain, yourcough worsens,or you producemore mucus.  Youhave nausea,vomiting, ordiarrhea.  Your fever returns or gets worse. SEEK IMMEDIATE MEDICAL CARE IF:  You havetrouble breathing, you become short of breath,or your skin ornails becomebluish.  You have severe painor stiffnessin the neck.  You develop a sudden headache, or pain in the face or ear.  You have nausea or  vomiting that you cannot control. MAKE SURE YOU:   Understand these instructions.  Will watch your condition.  Will get help right away if you are not doing well or get worse.   This information is not intended to replace advice given to you by your health care provider. Make sure you discuss any questions you have with your health care provider.   Document Released: 12/17/1999 Document Revised: 01/09/2014 Document Reviewed: 03/20/2011 Elsevier Interactive Patient Education [employer]     I personally performed the services described in this documentation, which was scribed in my presence. The recorded information has been reviewed and considered, and addended by me as needed.

## 2015-04-01 ENCOUNTER — Ambulatory Visit (INDEPENDENT_AMBULATORY_CARE_PROVIDER_SITE_OTHER): Payer: BC Managed Care – PPO

## 2015-04-01 ENCOUNTER — Ambulatory Visit (INDEPENDENT_AMBULATORY_CARE_PROVIDER_SITE_OTHER): Payer: BC Managed Care – PPO | Admitting: Family Medicine

## 2015-04-01 VITALS — BP 118/76 | HR 59 | Temp 98.2°F | Resp 16 | Ht 65.0 in | Wt 131.0 lb

## 2015-04-01 DIAGNOSIS — G43809 Other migraine, not intractable, without status migrainosus: Secondary | ICD-10-CM | POA: Diagnosis not present

## 2015-04-01 DIAGNOSIS — R0789 Other chest pain: Secondary | ICD-10-CM

## 2015-04-01 DIAGNOSIS — R05 Cough: Secondary | ICD-10-CM

## 2015-04-01 DIAGNOSIS — J111 Influenza due to unidentified influenza virus with other respiratory manifestations: Secondary | ICD-10-CM

## 2015-04-01 DIAGNOSIS — R110 Nausea: Secondary | ICD-10-CM

## 2015-04-01 DIAGNOSIS — R059 Cough, unspecified: Secondary | ICD-10-CM

## 2015-04-01 MED ORDER — KETOROLAC TROMETHAMINE 60 MG/2ML IM SOLN
60.0000 mg | Freq: Once | INTRAMUSCULAR | Status: AC
  Administered 2015-04-01: 60 mg via INTRAMUSCULAR

## 2015-04-01 MED ORDER — ONDANSETRON 4 MG PO TBDP
4.0000 mg | ORAL_TABLET | Freq: Once | ORAL | Status: AC
  Administered 2015-04-01: 4 mg via ORAL

## 2015-04-01 MED ORDER — ONDANSETRON 4 MG PO TBDP: 4.0000 mg | ORAL_TABLET | Freq: Three times a day (TID) | ORAL | Status: AC | PRN

## 2015-04-01 NOTE — Progress Notes (Addendum)
Subjective:  By signing my name below, I, Stann Ore, attest that this documentation has been prepared under the direction and in the presence of [name], MD. Electronically Signed: Stann Ore, Scribe. 04/01/2015 , 5:04 PM .  Patient was seen in Room 4 .   Patient ID: Amy [name], female    DOB: 07-10-1965, 50 y.o.   MRN: [id number] Chief Complaint  Patient presents with  . Headache    x 1 day  . Nausea    x 1 day  . Cough    x 1 week  . Back Pain    x this morning  . dydrated    x this morning  . lack of appetite    x this morning   HPI Amy [name] is a 50 y.o. female Was seen 4 days ago with cough fever; diagnosed with influenza [name]; prescription for tamiflu given; possibly within 48 hour window. mucinex or tessalon perles, and hycodan at night.   She states the tessalon perles aren't helping with the cough. She notes the hycodan cough syrup works well at night.  Her last measured fever was 3 days ago.   She's continued to cough up dark mucus. She's been taking tamiflu and tried going back to work yesterday. She noticed a headache at around 1:30-2:00PM yesterday afternoon. She has history of migraines (last 1-2 years ago), but this felt different. She went to sleep and the headache was gone this morning after 1 episode of night sweats last night. She took a shower and fatigue afterwards. She went back to work and her nausea kicked in. She also mentions soreness and discomfort in her right chest wall. The principal advised the patient to go home and return to work in 4 days. She denies any vomiting. She hasn't been wanting to drink any water or put anything in her stomach but does force herself to try to stay hydrated. She notes that her urination is normal.   She states that her periods are normal. She's on oral contraception for birth control. She denies chance of pregnancy.   Patient Active Problem List   Diagnosis Date Noted  . Appendicitis 09/18/2012     Past Medical History  Diagnosis Date  . Migraine aura without headache    Past Surgical History  Procedure Laterality Date  . Cervical biopsy  w/ loop electrode excision  2001    no further procedures needed  . Laparoscopic appendectomy N/A 09/18/2012    Procedure: APPENDECTOMY LAPAROSCOPIC;  Surgeon: Valarie Merino, MD;  Location: [facility];  Service: General;  Laterality: N/A;  . Appendectomy     Allergies  Allergen Reactions  . Penicillins Hives   Prior to Admission medications   Medication Sig Start Date End Date Taking? Authorizing Provider  aspirin 81 MG tablet Take 81 mg by mouth daily. Reported on 03/28/2015   Yes Historical Provider, MD  benzonatate (TESSALON) 100 MG capsule Take 1 capsule (100 mg total) by mouth 3 (three) times daily as needed for cough. 03/28/15  Yes Shade Flood, MD  calcium carbonate 200 MG capsule Take by mouth daily. Reported on 03/28/2015   Yes Historical Provider, MD  cetirizine (ZYRTEC) 10 MG tablet Take 10 mg by mouth daily.   Yes Historical Provider, MD  fluticasone (FLONASE) 50 MCG/ACT nasal spray Place into both nostrils daily.   Yes Historical Provider, MD  HYDROcodone-homatropine (HYCODAN) 5-1.5 MG/5ML syrup 35m by mouth a bedtime as needed for cough. 03/28/15  Yes  Shade FloodJeffrey R Benjamim Harnish, MD  Multiple Vitamin (MULTIVITAMIN) tablet Take 1 tablet by mouth daily. Reported on 03/28/2015   Yes Historical Provider, MD  norgestrel-ethinyl estradiol (CRYSELLE-28) 0.3-30 MG-MCG tablet Take 1 tablet by mouth daily. CONTINOUSLY FOR 3 MONTHS   Yes Historical Provider, MD  oseltamivir (TAMIFLU) 75 MG capsule Take 1 capsule (75 mg total) by mouth 2 (two) times daily. 03/28/15  Yes Shade FloodJeffrey R Yee Gangi, MD   Social History   Social History  . Marital Status: Single    Spouse Name: N/A  . Number of Children: N/A  . Years of Education: N/A   Occupational History  . Not on file.   Social History Main Topics  . Smoking status: Never Smoker   . Smokeless tobacco:  Never Used  . Alcohol Use: Yes     Comment: very occasionally  . Drug Use: No  . Sexual Activity: Yes    Birth Control/ Protection: Pill   Other Topics Concern  . Not on file   Social History Narrative   Review of Systems  Constitutional: Positive for chills, appetite change and fatigue.  Respiratory: Positive for cough and chest tightness. Negative for shortness of breath and wheezing.   Gastrointestinal: Positive for nausea.  Genitourinary: Negative for dysuria, frequency, hematuria and decreased urine volume.  Musculoskeletal: Positive for back pain.  Neurological: Positive for headaches.       Objective:   Physical Exam  Constitutional: She is oriented to person, place, and time. She appears well-developed and well-nourished. No distress.  HENT:  Head: Normocephalic and atraumatic.  Eyes: EOM are normal. Pupils are equal, round, and reactive to light.  Neck: Neck supple.  Cardiovascular: Normal rate.   Pulmonary/Chest: Effort normal. No respiratory distress.  Right posterior lower chest wall has discomfort with coughing in same area   Abdominal: Soft. Bowel sounds are normal. She exhibits no distension.  Musculoskeletal: Normal range of motion.  Neurological: She is alert and oriented to person, place, and time. She displays a negative Romberg sign.  No pronator drift  Skin: Skin is warm and dry.  Psychiatric: She has a normal mood and affect. Her behavior is normal.  Nursing note and vitals reviewed.   Orthostatic VS for the past 24 hrs (Last 3 readings):  BP- Lying Pulse- Lying BP- Sitting Pulse- Sitting BP- Standing at 0 minutes Pulse- Standing at 0 minutes  04/01/15 1736 133/79 mmHg 73 129/81 mmHg 80 135/85 mmHg 74     Filed Vitals:   04/01/15 1515  BP: 118/76  Pulse: 59  Temp: 98.2 F (36.8 C)  TempSrc: Oral  Resp: 16  Height: 5\' 5"  (1.651 m)  Weight: 131 lb (59.421 kg)  SpO2: 94%   Dg Chest 2 View  04/01/2015  CLINICAL DATA:  Cough and fever. The  patient was diagnosed with influenza 4 days ago. Initial encounter. EXAM: CHEST  2 VIEW COMPARISON:  None. FINDINGS: The lungs are clear. Heart size is normal. No pneumothorax or pleural effusion. No bony abnormality. IMPRESSION: Negative chest. Electronically Signed   By: Drusilla Kannerhomas  Dalessio M.D.   On: 04/01/2015 17:38      Assessment & Plan:   Amy [name] [place] is a 50 y.o. female Influenza with respiratory manifestation  Other migraine without status migrainosus, not intractable - Plan: ondansetron (ZOFRAN-ODT) disintegrating tablet 4 mg, ketorolac (TORADOL) injection 60 mg  Nausea without vomiting - Plan: ondansetron (ZOFRAN-ODT) disintegrating tablet 4 mg, Orthostatic vital signs  Right-sided chest wall pain - Plan: DG Chest 2 View  Cough - Plan: DG Chest 2 View  suspected migraine after initial influenza. No sign of pneumonia on exam. Chest wall pain due to cough likely. - Can increase hydrocodone cough syrup to every 6-8 hours for cough.   -toradol  IM x 1 For possible migraine   Small sips of fluids frequently  Ibuprofen up to 600 mg every 6 hours for chest wall pain, handout given   Zofran 1-2 every 8 hours as needed.   RTC precautions discussed.   Meds ordered this encounter  Medications  . ondansetron (ZOFRAN-ODT) disintegrating tablet 4 mg    Sig:   . ketorolac (TORADOL) injection 60 mg    Sig:    Patient Instructions       IF you received an x-ray today, you will receive an invoice from [facility]. Please contact [facility] at [phone] with questions or concerns regarding your invoice.   IF you received labwork today, you will receive an invoice from [employer] [facility]. Please contact [facility] at [phone] with questions or concerns regarding your invoice.   Our billing staff will not be able to assist you with questions regarding bills from these companies.  You will be contacted with the lab results as soon as  they are available. The fastest way to get your results is to activate your My Chart account. Instructions are located on the last page of this paperwork. If you have not heard from [place] regarding the results in 2 weeks, please contact this office.    Chest x-ray looks okay, I suspect the pain you're having his chest wall pain from coughing. See information on this below.  Your headache may be due to a migraine or secondary to the influenza. 1-2 Zofran as needed every 8 hours, small sips of fluids frequently, ibuprofen is okay to take for the chest wall pain and headache up to 600 mg every 6 hours.  Out of work for the next few days, rest, and if not improving into this weekend, can recheck to determine if other testing needed.  Return to the clinic or go to the nearest emergency room if any of your symptoms worsen or new symptoms occur.  Chest Wall Pain Chest wall pain is pain in or around the bones and muscles of your chest. Sometimes, an injury causes this pain. Sometimes, the cause may not be known. This pain may take several weeks or longer to get better. HOME CARE INSTRUCTIONS  Pay attention to any changes in your symptoms. Take these actions to help with your pain:   Rest as told by your health care provider.   Avoid activities that cause pain. These include any activities that use your chest muscles or your abdominal and side muscles to lift heavy items.   If directed, apply ice to the painful area:  Put ice in a plastic bag.  Place a towel between your skin and the bag.  Leave the ice on for 20 minutes, 2-3 times per day.  Take over-the-counter and prescription medicines only as told by your health care provider.  Do not use tobacco products, including cigarettes, chewing tobacco, and e-cigarettes. If you need help quitting, ask your health care provider.  Keep all follow-up visits as told by your health care provider. This is important. SEEK MEDICAL CARE IF:  You have a  fever.  Your chest pain becomes worse.  You have new symptoms. SEEK IMMEDIATE MEDICAL CARE IF:  You have nausea or vomiting.  You feel sweaty  or light-headed.  You have a cough with phlegm (sputum) or you cough up blood.  You develop shortness of breath.   This information is not intended to replace advice given to you by your health care provider. Make sure you discuss any questions you have with your health care provider.   Document Released: 12/19/2004 Document Revised: 09/09/2014 Document Reviewed: 03/16/2014 Elsevier Interactive Patient Education 2016 [employer]. Migraine Headache A migraine headache is an intense, throbbing pain on one or both sides of your head. A migraine can last for 30 minutes to several hours. CAUSES  The exact cause of a migraine headache is not always known. However, a migraine may be caused when nerves in the brain become irritated and release chemicals that cause inflammation. This causes pain. Certain things may also trigger migraines, such as:  Alcohol.  Smoking.  Stress.  Menstruation.  Aged cheeses.  Foods or drinks that contain nitrates, glutamate, aspartame, or tyramine.  Lack of sleep.  Chocolate.  Caffeine.  Hunger.  Physical exertion.  Fatigue.  Medicines used to treat chest pain (nitroglycerine), birth control pills, estrogen, and some blood pressure medicines. SIGNS AND SYMPTOMS  Pain on one or both sides of your head.  Pulsating or throbbing pain.  Severe pain that prevents daily activities.  Pain that is aggravated by any physical activity.  Nausea, vomiting, or both.  Dizziness.  Pain with exposure to bright lights, loud noises, or activity.  General sensitivity to bright lights, loud noises, or smells. Before you get a migraine, you may get warning signs that a migraine is coming (aura). An aura may include:  Seeing flashing lights.  Seeing bright spots, halos, or zigzag lines.  Having tunnel  vision or blurred vision.  Having feelings of numbness or tingling.  Having trouble talking.  Having muscle weakness. DIAGNOSIS  A migraine headache is often diagnosed based on:  Symptoms.  Physical exam.  A CT scan or MRI of your head. These imaging tests cannot diagnose migraines, but they can help rule out other causes of headaches. TREATMENT Medicines may be given for pain and nausea. Medicines can also be given to help prevent recurrent migraines.  HOME CARE INSTRUCTIONS  Only take over-the-counter or prescription medicines for pain or discomfort as directed by your health care provider. The use of long-term narcotics is not recommended.  Lie down in a dark, quiet room when you have a migraine.  Keep a journal to find out what may trigger your migraine headaches. For example, write down:  What you eat and drink.  How much sleep you get.  Any change to your diet or medicines.  Limit alcohol consumption.  Quit smoking if you smoke.  Get 7-9 hours of sleep, or as recommended by your health care provider.  Limit stress.  Keep lights dim if bright lights bother you and make your migraines worse. SEEK IMMEDIATE MEDICAL CARE IF:   Your migraine becomes severe.  You have a fever.  You have a stiff neck.  You have vision loss.  You have muscular weakness or loss of muscle control.  You start losing your balance or have trouble walking.  You feel faint or pass out.  You have severe symptoms that are different from your first symptoms. MAKE SURE YOU:   Understand these instructions.  Will watch your condition.  Will get help right away if you are not doing well or get worse.   This information is not intended to replace advice given to you by  your health care provider. Make sure you discuss any questions you have with your health care provider.   Document Released: 12/19/2004 Document Revised: 01/09/2014 Document Reviewed: 08/26/2012 Elsevier Interactive  Patient Education [employer]     I personally performed the services described in this documentation, which was scribed in my presence. The recorded information has been reviewed and considered, and addended by me as needed.

## 2015-04-01 NOTE — Patient Instructions (Addendum)
IF you received an x-ray today, you will receive an [facility] from [facility]. Please contact [facility] at [phone] with questions or concerns regarding your invoice.   IF you received labwork today, you will receive an invoice from [facility]. Please contact [facility] at [phone] with questions or concerns regarding your invoice.   Our billing staff will not be able to assist you with questions regarding bills from these companies.  You will be contacted with the lab results as soon as they are available. The fastest way to get your results is to activate your My Chart account. Instructions are located on the last page of this paperwork. If you have not heard from us regarding the results in 2 weeks, please contact this office.    Chest x-ray looks okay, I suspect the pain you're having his chest wall pain from coughing. See information on this below.  Your headache may be due to a migraine or secondary to the influenza. 1-2 Zofran as needed every 8 hours, small sips of fluids frequently, ibuprofen is okay to take for the chest wall pain and headache up to 600 mg every 6 hours.  Out of work for the next few days, rest, and if not improving into this weekend, can recheck to determine if other testing needed.  Return to the clinic or go to the nearest emergency room if any of your symptoms worsen or new symptoms occur.  Chest Wall Pain Chest wall pain is pain in or around the bones and muscles of your chest. Sometimes, an injury causes this pain. Sometimes, the cause may not be known. This pain may take several weeks or longer to get better. HOME CARE INSTRUCTIONS  Pay attention to any changes in your symptoms. Take these actions to help with your pain:   Rest as told by your health care provider.   Avoid activities that cause pain. These include any activities that use your chest muscles or your abdominal and side muscles to lift  heavy items.   If directed, apply ice to the painful area:  Put ice in a plastic bag.  Place a towel between your skin and the bag.  Leave the ice on for 20 minutes, 2-3 times per day.  Take over-the-counter and prescription medicines only as told by your health care provider.  Do not use tobacco products, including cigarettes, chewing tobacco, and e-cigarettes. If you need help quitting, ask your health care provider.  Keep all follow-up visits as told by your health care provider. This is important. SEEK MEDICAL CARE IF:  You have a fever.  Your chest pain becomes worse.  You have new symptoms. SEEK IMMEDIATE MEDICAL CARE IF:  You have nausea or vomiting.  You feel sweaty or light-headed.  You have a cough with phlegm (sputum) or you cough up blood.  You develop shortness of breath.   This information is not intended to replace advice given to you by your health care provider. Make sure you discuss any questions you have with your health care provider.   Document Released: 12/19/2004 Document Revised: 09/09/2014 Document Reviewed: 03/16/2014 Elsevier Interactive Patient Education 2016 [employer] Migraine Headache A migraine headache is an intense, throbbing pain on one or both sides of your head. A migraine can last for 30 minutes to several hours. CAUSES  The exact cause of a migraine headache is not always known. However, a migraine may be caused when nerves in the brain become irritated and  release chemicals that cause inflammation. This causes pain. Certain things may also trigger migraines, such as:  Alcohol.  Smoking.  Stress.  Menstruation.  Aged cheeses.  Foods or drinks that contain nitrates, glutamate, aspartame, or tyramine.  Lack of sleep.  Chocolate.  Caffeine.  Hunger.  Physical exertion.  Fatigue.  Medicines used to treat chest pain (nitroglycerine), birth control pills, estrogen, and some blood pressure medicines. SIGNS AND  SYMPTOMS  Pain on one or both sides of your head.  Pulsating or throbbing pain.  Severe pain that prevents daily activities.  Pain that is aggravated by any physical activity.  Nausea, vomiting, or both.  Dizziness.  Pain with exposure to bright lights, loud noises, or activity.  General sensitivity to bright lights, loud noises, or smells. Before you get a migraine, you may get warning signs that a migraine is coming (aura). An aura may include:  Seeing flashing lights.  Seeing bright spots, halos, or zigzag lines.  Having tunnel vision or blurred vision.  Having feelings of numbness or tingling.  Having trouble talking.  Having muscle weakness. DIAGNOSIS  A migraine headache is often diagnosed based on:  Symptoms.  Physical exam.  A CT scan or MRI of your head. These imaging tests cannot diagnose migraines, but they can help rule out other causes of headaches. TREATMENT Medicines may be given for pain and nausea. Medicines can also be given to help prevent recurrent migraines.  HOME CARE INSTRUCTIONS  Only take over-the-counter or prescription medicines for pain or discomfort as directed by your health care provider. The use of long-term narcotics is not recommended.  Lie down in a dark, quiet room when you have a migraine.  Keep a journal to find out what may trigger your migraine headaches. For example, write down:  What you eat and drink.  How much sleep you get.  Any change to your diet or medicines.  Limit alcohol consumption.  Quit smoking if you smoke.  Get 7-9 hours of sleep, or as recommended by your health care provider.  Limit stress.  Keep lights dim if bright lights bother you and make your migraines worse. SEEK IMMEDIATE MEDICAL CARE IF:   Your migraine becomes severe.  You have a fever.  You have a stiff neck.  You have vision loss.  You have muscular weakness or loss of muscle control.  You start losing your balance or have  trouble walking.  You feel faint or pass out.  You have severe symptoms that are different from your first symptoms. MAKE SURE YOU:   Understand these instructions.  Will watch your condition.  Will get help right away if you are not doing well or get worse.   This information is not intended to replace advice given to you by your health care provider. Make sure you discuss any questions you have with your health care provider.   Document Released: 12/19/2004 Document Revised: 01/09/2014 Document Reviewed: 08/26/2012 Elsevier Interactive Patient Education [employer]

## 2019-02-27 ENCOUNTER — Other Ambulatory Visit: Payer: Self-pay

## 2019-02-27 ENCOUNTER — Ambulatory Visit: Payer: BC Managed Care – PPO

## 2019-02-27 ENCOUNTER — Ambulatory Visit: Payer: BC Managed Care – PPO | Attending: Family

## 2019-02-27 DIAGNOSIS — Z23 Encounter for immunization: Secondary | ICD-10-CM | POA: Insufficient documentation

## 2019-02-27 NOTE — Progress Notes (Signed)
   Covid-19 Vaccination Clinic  Name:  Amy [name]    MRN: [id number] DOB: July 26, 1965  02/27/2019  Amy [name] was observed post Covid-19 immunization for 15 minutes without incidence. She was provided with Vaccine Information Sheet and instruction to access the V-Safe system.   Amy [name] was instructed to call 911 with any severe reactions post vaccine: Marland Kitchen Difficulty breathing  . Swelling of your face and throat  . A fast heartbeat  . A bad rash all over your body  . Dizziness and weakness    Immunizations Administered    Name Date Dose VIS Date Route   Moderna COVID-19 Vaccine 02/27/2019  3:13 PM 0.5 mL 12/03/2018 Intramuscular   Manufacturer: Moderna   Lot: [id number]   NDC: 97331-250-87

## 2019-04-01 ENCOUNTER — Ambulatory Visit: Payer: BC Managed Care – PPO | Attending: Family

## 2019-04-01 DIAGNOSIS — Z23 Encounter for immunization: Secondary | ICD-10-CM

## 2019-04-01 NOTE — Progress Notes (Signed)
   Covid-19 Vaccination Clinic  Name:  Amy [name]    MRN: [id number] DOB: Dec 10, 1965  04/01/2019  Amy [name] was observed post Covid-19 immunization for 15 minutes without incident. She was provided with Vaccine Information Sheet and instruction to access the V-Safe system.   Amy [name] was instructed to call 911 with any severe reactions post vaccine: Marland Kitchen Difficulty breathing  . Swelling of face and throat  . A fast heartbeat  . A bad rash all over body  . Dizziness and weakness   Immunizations Administered    Name Date Dose VIS Date Route   Moderna COVID-19 Vaccine 04/01/2019 10:25 AM 0.5 mL 12/03/2018 Intramuscular   Manufacturer: Moderna   Lot: [id number]   NDC: 22336-122-44

## 2020-06-30 ENCOUNTER — Other Ambulatory Visit: Payer: Self-pay | Admitting: Obstetrics and Gynecology

## 2020-06-30 DIAGNOSIS — Z8249 Family history of ischemic heart disease and other diseases of the circulatory system: Secondary | ICD-10-CM

## 2020-07-21 ENCOUNTER — Ambulatory Visit
Admission: RE | Admit: 2020-07-21 | Discharge: 2020-07-21 | Disposition: A | Discharge status: Home / Self Care | Payer: No Typology Code available for payment source | Source: Ambulatory Visit | Attending: Obstetrics and Gynecology | Admitting: Obstetrics and Gynecology

## 2020-07-21 DIAGNOSIS — Z8249 Family history of ischemic heart disease and other diseases of the circulatory system: Secondary | ICD-10-CM

## 2022-07-18 ENCOUNTER — Other Ambulatory Visit: Payer: Self-pay | Admitting: Family Medicine

## 2022-07-18 DIAGNOSIS — Z1231 Encounter for screening mammogram for malignant neoplasm of breast: Secondary | ICD-10-CM

## 2022-08-04 ENCOUNTER — Ambulatory Visit
Admission: RE | Admit: 2022-08-04 | Discharge: 2022-08-04 | Disposition: A | Discharge status: Home / Self Care | Payer: BC Managed Care – PPO | Source: Ambulatory Visit | Attending: Family Medicine | Admitting: Family Medicine

## 2022-08-04 DIAGNOSIS — Z1231 Encounter for screening mammogram for malignant neoplasm of breast: Secondary | ICD-10-CM

## 2023-04-20 ENCOUNTER — Other Ambulatory Visit: Payer: Self-pay | Admitting: Family Medicine

## 2023-04-20 DIAGNOSIS — Z1231 Encounter for screening mammogram for malignant neoplasm of breast: Secondary | ICD-10-CM

## 2023-04-23 ENCOUNTER — Other Ambulatory Visit: Payer: Self-pay | Admitting: Family Medicine

## 2023-04-23 DIAGNOSIS — M8580 Other specified disorders of bone density and structure, unspecified site: Secondary | ICD-10-CM

## 2023-08-06 ENCOUNTER — Ambulatory Visit

## 2023-11-14 ENCOUNTER — Emergency Department (HOSPITAL_BASED_OUTPATIENT_CLINIC_OR_DEPARTMENT_OTHER)
Admission: EM | Admit: 2023-11-14 | Discharge: 2023-11-14 | Disposition: A | Discharge status: Home / Self Care | Source: Ambulatory Visit | Attending: Emergency Medicine | Admitting: Emergency Medicine

## 2023-11-14 ENCOUNTER — Other Ambulatory Visit: Payer: Self-pay

## 2023-11-14 ENCOUNTER — Emergency Department (HOSPITAL_BASED_OUTPATIENT_CLINIC_OR_DEPARTMENT_OTHER)

## 2023-11-14 DIAGNOSIS — Z79899 Other long term (current) drug therapy: Secondary | ICD-10-CM | POA: Diagnosis not present

## 2023-11-14 DIAGNOSIS — R1031 Right lower quadrant pain: Secondary | ICD-10-CM | POA: Insufficient documentation

## 2023-11-14 LAB — CBC WITH DIFFERENTIAL/PLATELET
Abs Immature Granulocytes: 0.01 K/uL (ref 0.00–0.07)
Basophils Absolute: 0 K/uL (ref 0.0–0.1)
Basophils Relative: 0 %
Eosinophils Absolute: 0.1 K/uL (ref 0.0–0.5)
Eosinophils Relative: 1 %
HCT: 46 % (ref 36.0–46.0)
Hemoglobin: 15.6 g/dL — ABNORMAL HIGH (ref 12.0–15.0)
Immature Granulocytes: 0 %
Lymphocytes Relative: 28 %
Lymphs Abs: 1.8 K/uL (ref 0.7–4.0)
MCH: 30.3 pg (ref 26.0–34.0)
MCHC: 33.9 g/dL (ref 30.0–36.0)
MCV: 89.3 fL (ref 80.0–100.0)
Monocytes Absolute: 0.5 K/uL (ref 0.1–1.0)
Monocytes Relative: 8 %
Neutro Abs: 4 K/uL (ref 1.7–7.7)
Neutrophils Relative %: 63 %
Platelets: 288 K/uL (ref 150–400)
RBC: 5.15 MIL/uL — ABNORMAL HIGH (ref 3.87–5.11)
RDW: 11.9 % (ref 11.5–15.5)
WBC: 6.4 K/uL (ref 4.0–10.5)
nRBC: 0 % (ref 0.0–0.2)

## 2023-11-14 LAB — COMPREHENSIVE METABOLIC PANEL WITH GFR
ALT: 18 U/L (ref 0–44)
AST: 26 U/L (ref 15–41)
Albumin: 4.2 g/dL (ref 3.5–5.0)
Alkaline Phosphatase: 96 U/L (ref 38–126)
Anion gap: 9 (ref 5–15)
BUN: 18 mg/dL (ref 6–20)
CO2: 28 mmol/L (ref 22–32)
Calcium: 10.2 mg/dL (ref 8.9–10.3)
Chloride: 104 mmol/L (ref 98–111)
Creatinine, Ser: 0.66 mg/dL (ref 0.44–1.00)
GFR, Estimated: >60 mL/min (ref >60)
Glucose, Bld: 96 mg/dL (ref 70–99)
Potassium: 4 mmol/L (ref 3.5–5.1)
Sodium: 141 mmol/L (ref 135–145)
Total Bilirubin: 0.5 mg/dL (ref 0.0–1.2)
Total Protein: 7.1 g/dL (ref 6.5–8.1)

## 2023-11-14 LAB — URINALYSIS, ROUTINE W REFLEX MICROSCOPIC
Bilirubin Urine: NEGATIVE
Glucose, UA: NEGATIVE mg/dL
Hgb urine dipstick: NEGATIVE
Ketones, ur: NEGATIVE mg/dL
Leukocytes,Ua: NEGATIVE
Nitrite: NEGATIVE
Protein, ur: NEGATIVE mg/dL
Specific Gravity, Urine: 1.019 (ref 1.005–1.030)
pH: 6 (ref 5.0–8.0)

## 2023-11-14 LAB — LIPASE, BLOOD: Lipase: 37 U/L (ref 11–51)

## 2023-11-14 LAB — LACTIC ACID, PLASMA: Lactic Acid, Venous: 0.7 mmol/L (ref 0.5–1.9)

## 2023-11-14 LAB — PREGNANCY, URINE: Preg Test, Ur: NEGATIVE

## 2023-11-14 MED ORDER — MORPHINE SULFATE (PF) 4 MG/ML IV SOLN
4.0000 mg | Freq: Once | INTRAVENOUS | Status: AC
  Administered 2023-11-14: 4 mg via INTRAVENOUS
  Filled 2023-11-14: qty 1

## 2023-11-14 MED ORDER — ONDANSETRON 4 MG PO TBDP: 4.0000 mg | ORAL_TABLET | Freq: Three times a day (TID) | ORAL | 0 refills | Status: AC | PRN

## 2023-11-14 MED ORDER — IOHEXOL 300 MG/ML  SOLN
100.0000 mL | Freq: Once | INTRAMUSCULAR | Status: AC | PRN
  Administered 2023-11-14: 100 mL via INTRAVENOUS

## 2023-11-14 MED ORDER — DICYCLOMINE HCL 20 MG PO TABS: 20.0000 mg | ORAL_TABLET | Freq: Two times a day (BID) | ORAL | 0 refills | Status: AC

## 2023-11-14 MED ORDER — ONDANSETRON HCL 4 MG/2ML IJ SOLN
4.0000 mg | Freq: Once | INTRAMUSCULAR | Status: AC
  Administered 2023-11-14: 4 mg via INTRAVENOUS
  Filled 2023-11-14: qty 2

## 2023-11-14 MED ORDER — DICYCLOMINE HCL 10 MG PO CAPS
10.0000 mg | ORAL_CAPSULE | Freq: Once | ORAL | Status: AC
  Administered 2023-11-14: 10 mg via ORAL
  Filled 2023-11-14: qty 1

## 2023-11-14 MED ORDER — ONDANSETRON 4 MG PO TBDP
4.0000 mg | ORAL_TABLET | Freq: Once | ORAL | Status: AC
  Administered 2023-11-14: 4 mg via ORAL
  Filled 2023-11-14: qty 1

## 2023-11-14 NOTE — Discharge Instructions (Addendum)
 Today you were seen for abdominal pain.  Please pick up your medication and take as prescribed.  Please return to the ED if your pain becomes severe.  Please follow-up with gastroenterology if your symptoms persist for further evaluation and workup.  Thank you for letting us  treat you today. After reviewing your labs and imaging, I feel you are safe to go home. Please follow up with your PCP in the next several days and provide them with your records from this visit. Return to the Emergency Room if pain becomes severe or symptoms worsen.

## 2023-11-14 NOTE — ED Provider Notes (Signed)
 [place] [facility]PARTMENT AT [facility] Provider Note   CSN: [id number] Arrival date & time: 11/14/23  1420     Patient presents with: Abdominal Pain   Amy [name] is a 58 y.o. female past medical history significant for diverticulitis presents today for right lower quadrant abdominal pain since this morning.  Patient had an appendectomy in 2014.  Patient was seen at urgent care and sent here due to decreased bowel sounds.  Patient reports chills.  Patient denies fever, nausea, vomiting, urinary symptoms, diarrhea, constipation, any other complaints at this time.  Patient states that her diverticulitis normally presents on the left side not the right.    Abdominal Pain Associated symptoms: chills        Prior to Admission medications   Medication Sig Start Date End Date Taking? Authorizing Provider  dicyclomine (BENTYL) 20 MG tablet Take 1 tablet (20 mg total) by mouth 2 (two) times daily. 11/14/23  Yes Francis Ileana SAILOR, PA-C  JUNEL FE 1/20 1-20 MG-MCG tablet Take 1 tablet by mouth daily. 11/14/23  Yes [provider]  ondansetron  (ZOFRAN -ODT) 4 MG disintegrating tablet Take 1 tablet (4 mg total) by mouth every 8 (eight) hours as needed for nausea or vomiting. 11/14/23  Yes Bethany Cumming N, PA-C  aspirin 81 MG tablet Take 81 mg by mouth daily. Reported on 03/28/2015    [provider]  benzonatate  (TESSALON ) 100 MG capsule Take 1 capsule (100 mg total) by mouth 3 (three) times daily as needed for cough. 03/28/15   Levora Reyes SAUNDERS, MD  calcium carbonate 200 MG capsule Take by mouth daily. Reported on 03/28/2015    [provider]  cetirizine (ZYRTEC) 10 MG tablet Take 10 mg by mouth daily.    [provider]  fluticasone (FLONASE) 50 MCG/ACT nasal spray Place into both nostrils daily.    [provider]  HYDROcodone -homatropine (HYCODAN) 5-1.5 MG/5ML syrup 71m by mouth a bedtime as needed for cough. 03/28/15   Levora Reyes SAUNDERS, MD  Multiple Vitamin (MULTIVITAMIN) tablet Take 1 tablet by mouth daily. Reported on 03/28/2015    [provider]  norgestrel-ethinyl estradiol (CRYSELLE-28) 0.3-30 MG-MCG tablet Take 1 tablet by mouth daily. CONTINOUSLY FOR 3 MONTHS    [provider]  ondansetron  (ZOFRAN  ODT) 4 MG disintegrating tablet Take 1-2 tablets (4-8 mg total) by mouth every 8 (eight) hours as needed for nausea or vomiting. 04/01/15   Levora Reyes SAUNDERS, MD  oseltamivir  (TAMIFLU ) 75 MG capsule Take 1 capsule (75 mg total) by mouth 2 (two) times daily. 03/28/15   Levora Reyes SAUNDERS, MD    Allergies: Penicillins    Review of Systems  Constitutional:  Positive for chills.  Gastrointestinal:  Positive for abdominal pain.    Updated Vital Signs BP 101/61   Pulse 64   Temp 98.8 F (37.1 C)   Resp 17   SpO2 100%   Physical Exam Vitals and nursing note reviewed.  Constitutional:      General: She is not in acute distress.    Appearance: She is well-developed. She is not toxic-appearing.  HENT:     Head: Normocephalic and atraumatic.  Eyes:     Conjunctiva/sclera: Conjunctivae normal.  Cardiovascular:     Rate and Rhythm: Normal rate and regular rhythm.     Heart sounds: Normal heart sounds. No murmur heard. Pulmonary:     Effort: Pulmonary effort is normal. No respiratory distress.     Breath sounds: Normal breath sounds.  Abdominal:     General: There is no distension.     Palpations: Abdomen is soft.     Tenderness: There is abdominal tenderness in the right lower quadrant. There is guarding. There is no rebound.  Musculoskeletal:        General: No swelling.     Cervical back: Neck supple.  Skin:    General: Skin is warm and dry.     Capillary Refill: Capillary refill takes less than 2 seconds.  Neurological:     Mental Status: She is alert.  Psychiatric:        Mood and Affect: Mood normal.     (all labs ordered are listed, but only abnormal results are displayed) Labs  Reviewed  CBC WITH DIFFERENTIAL/PLATELET - Abnormal; Notable for the following components:      Result Value   RBC 5.15 (*)    Hemoglobin 15.6 (*)    All other components within normal limits  COMPREHENSIVE METABOLIC PANEL WITH GFR  URINALYSIS, ROUTINE W REFLEX MICROSCOPIC  PREGNANCY, URINE  LIPASE, BLOOD  LACTIC ACID, PLASMA    EKG: None  Radiology: CT ABDOMEN PELVIS W CONTRAST Result Date: 11/14/2023 CLINICAL DATA:  Right lower quadrant pain EXAM: CT ABDOMEN AND PELVIS WITH CONTRAST TECHNIQUE: Multidetector CT imaging of the abdomen and pelvis was performed using the standard protocol following bolus administration of intravenous contrast. RADIATION DOSE REDUCTION: This exam was performed according to the departmental dose-optimization program which includes automated exposure control, adjustment of the mA and/or kV according to patient size and/or use of iterative reconstruction technique. CONTRAST:  OMNIPAQUE  IOHEXOL  300 MG/ML  SOLN COMPARISON:  CT abdomen and pelvis 09/17/2012. FINDINGS: Lower chest: No acute abnormality. Hepatobiliary: No focal liver abnormality is seen. No gallstones, gallbladder wall thickening, or biliary dilatation. Pancreas: Unremarkable. No pancreatic ductal dilatation or surrounding inflammatory changes. Spleen: Normal in size without focal abnormality. Adrenals/Urinary Tract: Adrenal glands are unremarkable. Kidneys are normal, without renal calculi, focal lesion, or hydronephrosis. Bladder is unremarkable. Stomach/Bowel: Stomach is within normal limits. Appendix is surgically absent. No evidence of bowel wall thickening, distention, or inflammatory changes. Vascular/Lymphatic: No significant vascular findings are present. No enlarged abdominal or pelvic lymph nodes. Reproductive: Uterus and bilateral adnexa are unremarkable. Other: There is trace free fluid in the pelvis. No abdominal wall hernia. Musculoskeletal: No fracture is seen. IMPRESSION: 1. Trace free  fluid in the pelvis, likely physiologic. 2. No other acute process in the abdomen or pelvis. Electronically Signed   By: Greig Pique M.D.   On: 11/14/2023 17:59     Procedures   Medications Ordered in the ED  ondansetron  (ZOFRAN -ODT) disintegrating tablet 4 mg (has no administration in time range)  dicyclomine (BENTYL) capsule 10 mg (has no administration in time range)  morphine  (PF) 4 MG/ML injection 4 mg (4 mg Intravenous Given 11/14/23 1525)  ondansetron  (ZOFRAN ) injection 4 mg (4 mg Intravenous Given 11/14/23 1524)  iohexol  (OMNIPAQUE ) 300 MG/ML solution 100 mL (100 mLs Intravenous Contrast Given 11/14/23 1646)                                    Medical Decision Making Amount and/or Complexity of Data Reviewed Labs: ordered. Radiology: ordered.  Risk Prescription drug management.   This patient presents to the ED for concern of RLQ abd pain differential diagnosis includes appendicitis, diverticulitis, SBO, choledocholithiasis, acute cholecystitis, viral GI illness, ischemic bowel, volvulus  Additional history obtained   Additional history obtained from Electronic Medical Record External records from outside source obtained and reviewed including Care everywhere   Lab Tests:  I Ordered, and personally interpreted labs.  The pertinent results include: UA unremarkable, Urine preg negative, elevated hemoglobin at 15.6, lactic 0.7, CMP WNL, lipase 37   Imaging Studies ordered:  I ordered imaging studies including CT abdomen pelvis with contrast I independently visualized and interpreted imaging which showed trace free fluid in the pelvis, likely physiologic.  No other acute process in the abdomen or pelvis. I agree with the radiologist interpretation   Medicines ordered and prescription drug management:  I ordered medication including morphine  and Zofran , Bentyl    I have reviewed the patients home medicines and have made adjustments as needed   Problem List  / ED Course:  Patient tolerating PO intake prior to discharge without issue.  Considered for admission or further workup however patient's vital signs, physical exam, labs, and imaging are reassuring.  Patient given short course of Zofran  and Bentyl outpatient.  Patient to follow-up with gastroenterology if symptoms persist.  Patient given return precautions.  I feel patient is safe for discharge at this time.     Final diagnoses:  Right lower quadrant abdominal pain    ED Discharge Orders          Ordered    dicyclomine (BENTYL) 20 MG tablet  2 times daily        11/14/23 1849    ondansetron  (ZOFRAN -ODT) 4 MG disintegrating tablet  Every 8 hours PRN        11/14/23 1849               Francis Ileana SAILOR, PA-C 11/14/23 1850    Lenor Hollering, MD 11/15/23 1505

## 2023-11-14 NOTE — ED Triage Notes (Signed)
 Patient reports RLQ pain since this morning. Seen at urgent care and sent here due to decreased bowel sounds. Had appendix removed in 2014.

## 2023-12-25 ENCOUNTER — Other Ambulatory Visit (HOSPITAL_BASED_OUTPATIENT_CLINIC_OR_DEPARTMENT_OTHER)

## 2024-01-04 ENCOUNTER — Other Ambulatory Visit

## 2024-04-01 ENCOUNTER — Other Ambulatory Visit (HOSPITAL_BASED_OUTPATIENT_CLINIC_OR_DEPARTMENT_OTHER)
